# Patient Record
Sex: Female | Born: 1980 | Race: Black or African American | Hispanic: No | Marital: Married | Smoking: Never smoker
Health system: Southern US, Community
[De-identification: ages and names within clinical notes are randomized; demographics above are authoritative.]

## PROBLEM LIST (undated history)

## (undated) DIAGNOSIS — F329 Major depressive disorder, single episode, unspecified: Secondary | ICD-10-CM

## (undated) DIAGNOSIS — K219 Gastro-esophageal reflux disease without esophagitis: Secondary | ICD-10-CM

## (undated) DIAGNOSIS — F32A Depression, unspecified: Secondary | ICD-10-CM

## (undated) DIAGNOSIS — F419 Anxiety disorder, unspecified: Secondary | ICD-10-CM

## (undated) HISTORY — DX: Major depressive disorder, single episode, unspecified: F32.9

## (undated) HISTORY — PX: DILATION AND CURETTAGE OF UTERUS: SHX78

## (undated) HISTORY — DX: Depression, unspecified: F32.A

## (undated) HISTORY — DX: Gastro-esophageal reflux disease without esophagitis: K21.9

---

## 2004-10-21 ENCOUNTER — Ambulatory Visit: Payer: Self-pay | Admitting: Family Medicine

## 2013-02-12 ENCOUNTER — Encounter (HOSPITAL_COMMUNITY): Payer: Self-pay | Admitting: *Deleted

## 2013-02-12 ENCOUNTER — Emergency Department (HOSPITAL_COMMUNITY)
Admission: EM | Admit: 2013-02-12 | Discharge: 2013-02-13 | Disposition: A | Discharge status: Home / Self Care | Payer: 59 | Attending: Emergency Medicine | Admitting: Emergency Medicine

## 2013-02-12 ENCOUNTER — Emergency Department (HOSPITAL_COMMUNITY): Payer: 59

## 2013-02-12 DIAGNOSIS — F411 Generalized anxiety disorder: Secondary | ICD-10-CM | POA: Insufficient documentation

## 2013-02-12 DIAGNOSIS — Z79899 Other long term (current) drug therapy: Secondary | ICD-10-CM | POA: Insufficient documentation

## 2013-02-12 DIAGNOSIS — Z3202 Encounter for pregnancy test, result negative: Secondary | ICD-10-CM | POA: Insufficient documentation

## 2013-02-12 DIAGNOSIS — R109 Unspecified abdominal pain: Secondary | ICD-10-CM | POA: Insufficient documentation

## 2013-02-12 HISTORY — DX: Anxiety disorder, unspecified: F41.9

## 2013-02-12 LAB — URINALYSIS, ROUTINE W REFLEX MICROSCOPIC
Bilirubin Urine: NEGATIVE
Glucose, UA: NEGATIVE mg/dL
Hgb urine dipstick: NEGATIVE
Ketones, ur: NEGATIVE mg/dL
Leukocytes, UA: NEGATIVE
Nitrite: NEGATIVE
Protein, ur: NEGATIVE mg/dL
Specific Gravity, Urine: 1.015 (ref 1.005–1.030)
Urobilinogen, UA: 1 mg/dL (ref 0.0–1.0)
pH: 7 (ref 5.0–8.0)

## 2013-02-12 LAB — PREGNANCY, URINE: Preg Test, Ur: NEGATIVE

## 2013-02-12 NOTE — ED Provider Notes (Signed)
History     CSN: [id number]  Arrival date & time 02/12/13  2231   First MD Initiated Contact with Patient 02/12/13 2255      Chief Complaint  Patient presents with  . Abdominal Pain  . Back Pain    (Consider location/radiation/quality/duration/timing/severity/associated sxs/prior treatment) Patient is a 32 y.o. female presenting with abdominal pain and back pain.  Abdominal Pain Associated symptoms: no chest pain, no cough, no fever, no shortness of breath and no vomiting   Back Pain Associated symptoms: abdominal pain   Associated symptoms: no chest pain, no fever, no headaches and no numbness    Miranda Oliver is a 32 y.o. female who presents to the Emergency Department complaining of sharp stabbing left lower abdominal pain that radiates to the lower back. Currently there is no pain. Had a bowel movement earlier today.  Denies fever, chills, nausea, vomiting. Past Medical History  Diagnosis Date  . Anxiety     Past Surgical History  Procedure Laterality Date  . Dilation and curettage of uterus      History reviewed. No pertinent family history.  History  Substance Use Topics  . Smoking status: Never Smoker   . Smokeless tobacco: Not on file  . Alcohol Use: Yes     Comment: occassional     OB History   Grav Para Term Preterm Abortions TAB SAB Ect Mult Living                  Review of Systems  Constitutional: Negative for fever.       10 Systems reviewed and are negative for acute change except as noted in the HPI.  HENT: Negative for congestion.   Eyes: Negative for discharge and redness.  Respiratory: Negative for cough and shortness of breath.   Cardiovascular: Negative for chest pain.  Gastrointestinal: Positive for abdominal pain. Negative for vomiting.  Musculoskeletal: Negative for back pain.  Skin: Negative for rash.  Neurological: Negative for syncope, numbness and headaches.  Psychiatric/Behavioral:       No behavior change.    Allergies   Review of patient's allergies indicates no known allergies.  Home Medications   Current Outpatient Rx  Name  Route  Sig  Dispense  Refill  . acetaminophen (TYLENOL) 500 MG tablet   Oral   Take 1,000 mg by mouth every 6 (six) hours as needed for pain.         . clonazePAM (KLONOPIN) 0.5 MG tablet   Oral   Take 0.5 mg by mouth daily as needed for anxiety.         . ranitidine (ZANTAC) 150 MG tablet   Oral   Take 150 mg by mouth daily.           BP 149/73  Pulse 80  Temp(Src) 98.6 F (37 C) (Oral)  Ht 5\' 2"  (1.575 m)  Wt 249 lb (112.946 kg)  BMI 45.53 kg/m2  SpO2 100%  Physical Exam  Nursing note and vitals reviewed. Constitutional: She appears well-developed and well-nourished.  Awake, alert, nontoxic appearance.  HENT:  Head: Normocephalic and atraumatic.  Right Ear: External ear normal.  Left Ear: External ear normal.  Eyes: EOM are normal. Pupils are equal, round, and reactive to light. Right eye exhibits no discharge. Left eye exhibits no discharge.  Neck: Normal range of motion. Neck supple.  Cardiovascular: Normal rate and intact distal pulses.   Pulmonary/Chest: Effort normal and breath sounds normal. She exhibits no tenderness.  Abdominal: Soft.  Bowel sounds are normal. There is no tenderness. There is no rebound.  Musculoskeletal: She exhibits no tenderness.  Baseline ROM, no obvious new focal weakness.  Neurological:  Mental status and motor strength appears baseline for patient and situation.  Skin: No rash noted.  Psychiatric: She has a normal mood and affect.    ED Course  Procedures (including critical care time) Results for orders placed during the hospital encounter of 02/12/13  PREGNANCY, URINE      Result Value Range   Preg Test, Ur NEGATIVE  NEGATIVE  URINALYSIS, ROUTINE W REFLEX MICROSCOPIC      Result Value Range   Color, Urine YELLOW  YELLOW   APPearance CLEAR  CLEAR   Specific Gravity, Urine 1.015  1.005 - 1.030   pH 7.0  5.0 -  8.0   Glucose, UA NEGATIVE  NEGATIVE mg/dL   Hgb urine dipstick NEGATIVE  NEGATIVE   Bilirubin Urine NEGATIVE  NEGATIVE   Ketones, ur NEGATIVE  NEGATIVE mg/dL   Protein, ur NEGATIVE  NEGATIVE mg/dL   Urobilinogen, UA 1.0  0.0 - 1.0 mg/dL   Nitrite NEGATIVE  NEGATIVE   Leukocytes, UA NEGATIVE  NEGATIVE    Dg Abd 1 View  02/12/2013  *RADIOLOGY REPORT*  Clinical Data: Abdominal pain.  Back pain.  Constipation for 1 week.  Previous D&C 2 months ago.  Negative urine pregnancy test tonight.  ABDOMEN - 1 VIEW  Comparison: None.  Findings: There is a nonobstructive bowel gas pattern.  No evidence for free intraperitoneal air on this supine view.  No organomegaly evident. Visualized osseous structures have a normal appearance.  IMPRESSION: Negative exam.   Original Report Authenticated By: Norva Pavlov, M.D.     MDM  Patient with sudden onset lower left abdominal pain with radiation to the back. Xray shows stool only in the colon. Reviewed results with patient. Pt stable in ED with no significant deterioration in condition.The patient appears reasonably screened and/or stabilized for discharge and I doubt any other medical condition or other [facility] requiring further screening, evaluation, or treatment in the ED at this time prior to discharge.  MDM Reviewed: nursing note and vitals Interpretation: x-ray and labs           [facility]. Colon Branch, MD 02/12/13 [phone]

## 2013-02-12 NOTE — ED Notes (Signed)
Pt with abd pain and back pain, denies burning on urination, denies N/V, diarrhea earlier this week, + vaginal discharge

## 2013-02-13 NOTE — ED Notes (Signed)
Discharge instructions reviewed with pt, questions answered. Pt verbalized understanding.  

## 2014-02-15 ENCOUNTER — Encounter: Payer: Self-pay | Admitting: *Deleted

## 2014-02-15 ENCOUNTER — Ambulatory Visit (INDEPENDENT_AMBULATORY_CARE_PROVIDER_SITE_OTHER): Payer: 59 | Admitting: Obstetrics and Gynecology

## 2014-02-15 ENCOUNTER — Encounter: Payer: Self-pay | Admitting: Obstetrics and Gynecology

## 2014-02-15 ENCOUNTER — Encounter (INDEPENDENT_AMBULATORY_CARE_PROVIDER_SITE_OTHER): Payer: Self-pay

## 2014-02-15 VITALS — BP 120/72 | Ht 63.0 in | Wt 248.0 lb

## 2014-02-15 DIAGNOSIS — Z3169 Encounter for other general counseling and advice on procreation: Secondary | ICD-10-CM | POA: Insufficient documentation

## 2014-02-15 NOTE — Progress Notes (Signed)
This chart was scribed by Bennett Scrapehristina Taylor, Medical Scribe, for Dr. Christin BachJohn Leea Rambeau on 02/15/14 at 12:39 PM. This chart was reviewed by Dr. Christin BachJohn Eveleen Mcnear and is accurate.    [facility] Visit  Patient name: Miranda Oliver MRN [id number]  Date of birth: 02-02-1981  CC & HPI:  Miranda KaysKimberly N Blassingame is a 33 y.o. female presenting today for procreation advice. Pt reports that she is G3P1A2. First child born close to due date, induced on week before. Child is now 31 years old. Last two pregnancies have ended in miscarriages. First miscarriage ended at 9 weeks with no identified heart beat and 2nd miscarriage ended at 14 weeks after heart beat was identified and then lost. All 3 pregnancies were fathered by the same partner. [facility] [place]. LNMP was 01/22/14. Menses last 4 days while on birth control pills. Had Mirena but had it taken out due to vaginal bleeding complications. Menses are regular while not on contraception devices. Not currently trying to get pregnant due to being on klonopin and 10 mg Prozac currently for depression/anxiety s/p miscarriages.   ROS:  None Complaining of difficulty carrying pregnancies to term  Pertinent History Reviewed:  Medical & Surgical Hx:  Reviewed: Significant for D&C. Denies HTN history  Medications: Reviewed & Updated - see associated section Social History: Reviewed -  reports that she has never smoked. She has never used smokeless tobacco.  Objective Findings:  Vitals: BP 120/72  Ht 5\' 3"  (1.6 m)  Wt 248 lb (112.492 kg)  BMI 43.94 kg/m2  LMP 01/22/2014  Physical Examination: Not indicated   Assessment & Plan:  A: 1. Procreation advice   P: 1. Stop Klonopin today 2. Taper Prozac in 2 weeks and then stop 3. Finish birth control packet and then start pregnancy vitamins 4. Monitor menses for the next 3 months and f/u in 4 months

## 2014-02-15 NOTE — Patient Instructions (Signed)
Stop Klonopin today. Finish birth control pill packet and start prenantal vitamins after last pill. Taper yourself off of Prozac in 2 weeks. Check out [email or web address]. Make a period calender for 3 months and follow up. Check pregnancy test if 5 days late for period and call the office.

## 2014-02-16 ENCOUNTER — Ambulatory Visit: Payer: Self-pay | Admitting: Obstetrics and Gynecology

## 2014-05-11 ENCOUNTER — Ambulatory Visit: Payer: 59 | Admitting: Adult Health

## 2014-05-24 ENCOUNTER — Other Ambulatory Visit: Payer: Self-pay | Admitting: Obstetrics and Gynecology

## 2014-05-24 DIAGNOSIS — O3680X0 Pregnancy with inconclusive fetal viability, not applicable or unspecified: Secondary | ICD-10-CM

## 2014-05-24 DIAGNOSIS — O09299 Supervision of pregnancy with other poor reproductive or obstetric history, unspecified trimester: Secondary | ICD-10-CM

## 2014-05-25 ENCOUNTER — Ambulatory Visit (INDEPENDENT_AMBULATORY_CARE_PROVIDER_SITE_OTHER): Payer: 59

## 2014-05-25 ENCOUNTER — Other Ambulatory Visit: Payer: 59

## 2014-05-25 DIAGNOSIS — O09299 Supervision of pregnancy with other poor reproductive or obstetric history, unspecified trimester: Secondary | ICD-10-CM

## 2014-05-25 DIAGNOSIS — O3680X0 Pregnancy with inconclusive fetal viability, not applicable or unspecified: Secondary | ICD-10-CM

## 2014-05-25 NOTE — Progress Notes (Addendum)
U/S-by LMP dates pt would be 8+ wks although pt had 1st +UPT at home on 05/20/2014 and stated that she had [facility] on Friday which had shown a Quant of 58 per pt. U/s today shows anteverted uterus noted with thickened endometrium, bilateral ovaries appear WNL no free fluid noted within pelvis. I spoke to Cyril MourningJennifer Griffin she ordered labs today and will follow up with patient tomorrow.

## 2014-05-26 ENCOUNTER — Telehealth: Payer: Self-pay | Admitting: Adult Health

## 2014-05-26 LAB — PROGESTERONE: Progesterone: 9 ng/mL

## 2014-05-26 LAB — HCG, QUANTITATIVE, PREGNANCY: hCG, Beta Chain, Quant, S: 609 m[IU]/mL

## 2014-05-26 MED ORDER — PROGESTERONE MICRONIZED 200 MG PO CAPS: ORAL_CAPSULE | ORAL | Status: DC

## 2014-05-26 NOTE — Telephone Encounter (Signed)
Pt aware of labs will rx prometrium 200 mg 1 in vagina at hs #30 with 3 refills, [facility] and keep US appt, discussed with Dr Despina HiddenEure

## 2014-05-26 NOTE — Telephone Encounter (Signed)
Spoke with pt and informed her of her lab results, pt aware that she would need either progesterone suppositories or prometrium. Pt wanted prometrium instead of the progesterone suppositories so she could get it at the same pharmacy.  I spoke with JAG and she advised that she would need to have blood drawn again on Monday since US appointment was pushed back a week per pt request. Pt switched up front to make appointment for Monday for a QHCG.

## 2014-05-29 ENCOUNTER — Other Ambulatory Visit: Payer: 59

## 2014-05-29 DIAGNOSIS — O09299 Supervision of pregnancy with other poor reproductive or obstetric history, unspecified trimester: Secondary | ICD-10-CM

## 2014-05-30 ENCOUNTER — Telehealth: Payer: Self-pay | Admitting: *Deleted

## 2014-05-30 LAB — HCG, QUANTITATIVE, PREGNANCY: hCG, Beta Chain, Quant, S: 2547.2 m[IU]/mL

## 2014-05-30 NOTE — Telephone Encounter (Signed)
I spoke to KRB about results, she advised numbers look good and for the pt to keep her appointment, no need to recheck blood work. Pt aware of results. Pt advised to keep her appointment that she already has scheduled and to keep using the medication as adivsed. Pt also advised to call the office if anything changes, if bleeding occurs, pain occurs etc. Pt verbalized understanding.

## 2014-06-09 ENCOUNTER — Other Ambulatory Visit: Payer: 59

## 2014-06-11 LAB — OB RESULTS CONSOLE GC/CHLAMYDIA
Chlamydia: NEGATIVE
Gonorrhea: NEGATIVE

## 2014-06-12 ENCOUNTER — Telehealth: Payer: Self-pay | Admitting: Women's Health

## 2014-06-12 NOTE — Telephone Encounter (Signed)
Patient states that she is having back pain/muscle spasm, she is pregnant but not sure how far along she is, she has a follow up u/s scheduled for early next week.  Advised her to take Tylenol and let us know if she has any other problems, pt verbalized understanding.

## 2014-06-14 ENCOUNTER — Other Ambulatory Visit: Payer: Self-pay | Admitting: Women's Health

## 2014-06-14 DIAGNOSIS — O3680X0 Pregnancy with inconclusive fetal viability, not applicable or unspecified: Secondary | ICD-10-CM

## 2014-06-19 ENCOUNTER — Ambulatory Visit: Payer: 59 | Admitting: Obstetrics and Gynecology

## 2014-06-19 ENCOUNTER — Ambulatory Visit (INDEPENDENT_AMBULATORY_CARE_PROVIDER_SITE_OTHER): Payer: BC Managed Care – PPO

## 2014-06-19 ENCOUNTER — Other Ambulatory Visit: Payer: Self-pay | Admitting: Women's Health

## 2014-06-19 ENCOUNTER — Ambulatory Visit: Payer: BC Managed Care – PPO | Admitting: Obstetrics and Gynecology

## 2014-06-19 DIAGNOSIS — O26849 Uterine size-date discrepancy, unspecified trimester: Secondary | ICD-10-CM

## 2014-06-19 DIAGNOSIS — O3680X0 Pregnancy with inconclusive fetal viability, not applicable or unspecified: Secondary | ICD-10-CM

## 2014-06-19 NOTE — Progress Notes (Signed)
U/S-single IUP with +FCA noted, FHR-168 bpm, CRL c/w 7+6wks EDD 01/30/2015, cx appears closed, bilateral adnexa appears WNL

## 2014-06-27 ENCOUNTER — Encounter: Payer: Self-pay | Admitting: Women's Health

## 2014-06-27 ENCOUNTER — Ambulatory Visit (INDEPENDENT_AMBULATORY_CARE_PROVIDER_SITE_OTHER): Payer: BC Managed Care – PPO | Admitting: Women's Health

## 2014-06-27 VITALS — BP 150/60 | Ht 62.0 in | Wt 265.0 lb

## 2014-06-27 DIAGNOSIS — E669 Obesity, unspecified: Secondary | ICD-10-CM | POA: Insufficient documentation

## 2014-06-27 DIAGNOSIS — Z331 Pregnant state, incidental: Secondary | ICD-10-CM

## 2014-06-27 DIAGNOSIS — Z3480 Encounter for supervision of other normal pregnancy, unspecified trimester: Secondary | ICD-10-CM | POA: Insufficient documentation

## 2014-06-27 DIAGNOSIS — Z36 Encounter for antenatal screening of mother: Secondary | ICD-10-CM

## 2014-06-27 DIAGNOSIS — Z1389 Encounter for screening for other disorder: Secondary | ICD-10-CM

## 2014-06-27 DIAGNOSIS — Z3481 Encounter for supervision of other normal pregnancy, first trimester: Secondary | ICD-10-CM

## 2014-06-27 LAB — CBC
HCT: 34.4 % — ABNORMAL LOW (ref 36.0–46.0)
Hemoglobin: 11.8 g/dL — ABNORMAL LOW (ref 12.0–15.0)
MCH: 29.5 pg (ref 26.0–34.0)
MCHC: 34.3 g/dL (ref 30.0–36.0)
MCV: 86 fL (ref 78.0–100.0)
Platelets: 290 10*3/uL (ref 150–400)
RBC: 4 MIL/uL (ref 3.87–5.11)
RDW: 14.5 % (ref 11.5–15.5)
WBC: 4.4 10*3/uL (ref 4.0–10.5)

## 2014-06-27 LAB — POCT URINALYSIS DIPSTICK
Blood, UA: NEGATIVE
Glucose, UA: NEGATIVE
Ketones, UA: NEGATIVE
Leukocytes, UA: NEGATIVE
Nitrite, UA: NEGATIVE

## 2014-06-27 NOTE — Patient Instructions (Signed)

## 2014-06-27 NOTE — Progress Notes (Signed)
  Subjective:  Miranda Oliver is a 33 y.o. 584P1021 African American female at 45752w0d by 7wk u/s, being seen today for her first obstetrical visit.  Her obstetrical history is significant for obesity, term uncomplicated svd in 2007, then 2 MABs requiring d&c. Taking progesterone suppositories prophylactically. Denies h/o HTN, states she works at [employer] and has been taking her bp this pregnancy, and systolic has generally been in 150s. Pregnancy history fully reviewed.  Patient reports some occ nause, no vomiting- declines meds. Has had white d/c since Mirena removed in 2013- no itching/irritation/odor. Denies vb, cramping, uti s/s.  BP 150/60  Ht 5\' 2"  (1.575 m)  Wt 265 lb (120.203 kg)  BMI 48.46 kg/m2  LMP 03/29/2014 BP retake still 150/60  HISTORY: OB History  Gravida Para Term Preterm AB SAB TAB Ectopic Multiple Living  4 1 1  0 2 2 0 0 0 1    # Outcome Date GA Lbr Len/2nd Weight Sex Delivery Anes PTL Lv  4 CUR           3 TRM 11/18/06 323752w0d  6 lb 13 oz (3.09 kg) M SVD EPI  Y  2 SAB              Comments: D&C  1 SAB              Comments: D&C     Past Medical History  Diagnosis Date  . Anxiety   . GERD (gastroesophageal reflux disease)   . Depression    Past Surgical History  Procedure Laterality Date  . Dilation and curettage of uterus  2013 & 2014     MAB x2    Family History  Problem Relation Age of Onset  . Stroke Mother   . Hypertension Mother   . Diabetes Mother   . Hypertension Sister   . Hypertension Father     Exam   System:     General: Well developed & nourished, no acute distress   Skin: Warm & dry, normal coloration and turgor, no rashes   Neurologic: Alert & oriented, normal mood   Cardiovascular: Regular rate & rhythm   Respiratory: Effort & rate normal, LCTAB, acyanotic   Abdomen: Soft, non tender   Extremities: normal strength, tone   Thin prep pap smear 2014 neg in [place] at [facility]  FHR: 168 via informal  transabdominal u/s   Assessment:   Pregnancy: [id number] Patient Active Problem List   Diagnosis Date Noted  . Supervision of other normal pregnancy 06/27/2014    Priority: High  . General counseling and advice for procreative management 02/15/2014    51752w0d G4P1021 New OB visit Elevated bp today- no h/o HTN Mild nausea of pregnancy Obesity H/O MAB x 2 w/ 2 D&Cs  Plan:  Initial labs drawn Continue prenatal vitamins Problem list reviewed and updated Reviewed n/v relief measures and warning s/s to report Reviewed recommended weight gain based on pre-gravid BMI Encouraged well-balanced diet Genetic Screening discussed Integrated Screen: requested Cystic fibrosis screening discussed requested Ultrasound discussed; fetal survey: requested Follow up in 2 days for bp recheck, then 3weeks for visit and 1st it/nt CCNC completed Get pap results from [facility] from [address]2014  Aharon Carriere, Emmaly Randall CNM, [facility]BC 06/27/2014 2:11 PM

## 2014-06-28 ENCOUNTER — Encounter: Payer: Self-pay | Admitting: Women's Health

## 2014-06-28 ENCOUNTER — Telehealth: Payer: Self-pay | Admitting: Advanced Practice Midwife

## 2014-06-28 LAB — GC/CHLAMYDIA PROBE AMP
CT Probe RNA: NEGATIVE
GC Probe RNA: NEGATIVE

## 2014-06-28 LAB — URINE CULTURE
Colony Count: NO GROWTH
Organism ID, Bacteria: NO GROWTH

## 2014-06-28 LAB — DRUG SCREEN, URINE, NO CONFIRMATION
Amphetamine Screen, Ur: NEGATIVE
Barbiturate Quant, Ur: NEGATIVE
Benzodiazepines.: NEGATIVE
Cocaine Metabolites: NEGATIVE
Creatinine,U: 283.4 mg/dL
Marijuana Metabolite: NEGATIVE
Methadone: NEGATIVE
Opiate Screen, Urine: NEGATIVE
Phencyclidine (PCP): NEGATIVE
Propoxyphene: NEGATIVE

## 2014-06-28 LAB — OXYCODONE SCREEN, UA, RFLX CONFIRM: Oxycodone Screen, Ur: NEGATIVE ng/mL

## 2014-06-28 LAB — URINALYSIS, ROUTINE W REFLEX MICROSCOPIC
Bilirubin Urine: NEGATIVE
Glucose, UA: NEGATIVE mg/dL
Hgb urine dipstick: NEGATIVE
Ketones, ur: NEGATIVE mg/dL
Leukocytes, UA: NEGATIVE
Nitrite: NEGATIVE
Protein, ur: NEGATIVE mg/dL
Specific Gravity, Urine: >1.03 — ABNORMAL HIGH (ref 1.005–1.030)
Urobilinogen, UA: 1 mg/dL (ref 0.0–1.0)
pH: 6.5 (ref 5.0–8.0)

## 2014-06-28 LAB — RPR

## 2014-06-28 LAB — ABO AND RH: Rh Type: POSITIVE

## 2014-06-28 LAB — VARICELLA ZOSTER ANTIBODY, IGG: Varicella IgG: 2309 Index — ABNORMAL HIGH (ref <135.00)

## 2014-06-28 LAB — SICKLE CELL SCREEN: Sickle Cell Screen: NEGATIVE

## 2014-06-28 LAB — RUBELLA SCREEN: Rubella: 2.1 Index — ABNORMAL HIGH (ref <0.90)

## 2014-06-28 LAB — HIV ANTIBODY (ROUTINE TESTING W REFLEX): HIV 1&2 Ab, 4th Generation: NONREACTIVE

## 2014-06-28 LAB — HEPATITIS B SURFACE ANTIGEN: Hepatitis B Surface Ag: NEGATIVE

## 2014-06-28 LAB — CYSTIC FIBROSIS DIAGNOSTIC STUDY

## 2014-06-28 LAB — ANTIBODY SCREEN: Antibody Screen: NEGATIVE

## 2014-06-28 NOTE — Telephone Encounter (Signed)
Pt informed that all lab results were normal as of today.

## 2014-06-29 ENCOUNTER — Ambulatory Visit (INDEPENDENT_AMBULATORY_CARE_PROVIDER_SITE_OTHER): Payer: Self-pay | Admitting: Advanced Practice Midwife

## 2014-06-29 VITALS — BP 140/70 | Wt 263.0 lb

## 2014-06-29 DIAGNOSIS — Z331 Pregnant state, incidental: Secondary | ICD-10-CM

## 2014-06-29 DIAGNOSIS — Z1389 Encounter for screening for other disorder: Secondary | ICD-10-CM

## 2014-06-29 DIAGNOSIS — Z3480 Encounter for supervision of other normal pregnancy, unspecified trimester: Secondary | ICD-10-CM

## 2014-06-29 DIAGNOSIS — Z3481 Encounter for supervision of other normal pregnancy, first trimester: Secondary | ICD-10-CM

## 2014-06-29 LAB — POCT URINALYSIS DIPSTICK
Blood, UA: NEGATIVE
Glucose, UA: NEGATIVE
Ketones, UA: NEGATIVE
Leukocytes, UA: NEGATIVE
Nitrite, UA: NEGATIVE

## 2014-06-29 NOTE — Progress Notes (Signed)
[id number] 6815w2d Estimated Date of Delivery: 01/30/15  Blood pressure 140/70, weight 263 lb (119.296 kg), last menstrual period 03/29/2014.   BP weight and urine results all reviewed and noted.  Please refer to the obstetrical flow sheet for the fundal height and fetal heart rate documentation: Here for b/p f/u;  Normal-ish today, not medication worthy  Patient reports good fetal movement, denies any bleeding and no rupture of membranes symptoms or regular contractions. Patient is without complaints. All questions were answered.  Plan:  Continued routine obstetrical care, keep eye on b/p  Follow up in 3 weeks for OB appointment, NT/IT

## 2014-07-04 ENCOUNTER — Encounter: Payer: Self-pay | Admitting: Women's Health

## 2014-07-07 ENCOUNTER — Telehealth: Payer: Self-pay | Admitting: Obstetrics & Gynecology

## 2014-07-07 NOTE — Telephone Encounter (Signed)
Pt states feels bloating what can she take. Pt takes has been taking tums. Pt informed unable to take any thing else for bloating due to early pregnancy. Pt verbalized understanding.

## 2014-07-10 ENCOUNTER — Telehealth: Payer: Self-pay | Admitting: Women's Health

## 2014-07-10 NOTE — Telephone Encounter (Signed)
Pt states has some aching in vaginal area after being on feet all day, no bleeding and no cramping.  Advised pt to take Tylenol as needed for pain, push fluids and rest when can and notify us if has any bleeding or severe cramping.  Pt verbalized understanding.

## 2014-07-19 ENCOUNTER — Other Ambulatory Visit: Payer: BC Managed Care – PPO

## 2014-07-19 ENCOUNTER — Encounter: Payer: BC Managed Care – PPO | Admitting: Advanced Practice Midwife

## 2014-07-20 ENCOUNTER — Encounter: Payer: Self-pay | Admitting: Advanced Practice Midwife

## 2014-07-20 ENCOUNTER — Ambulatory Visit (INDEPENDENT_AMBULATORY_CARE_PROVIDER_SITE_OTHER): Payer: Self-pay | Admitting: Advanced Practice Midwife

## 2014-07-20 ENCOUNTER — Ambulatory Visit (INDEPENDENT_AMBULATORY_CARE_PROVIDER_SITE_OTHER): Payer: BC Managed Care – PPO

## 2014-07-20 VITALS — BP 128/76 | Wt 258.0 lb

## 2014-07-20 DIAGNOSIS — Z1389 Encounter for screening for other disorder: Secondary | ICD-10-CM

## 2014-07-20 DIAGNOSIS — Z3481 Encounter for supervision of other normal pregnancy, first trimester: Secondary | ICD-10-CM

## 2014-07-20 DIAGNOSIS — Z331 Pregnant state, incidental: Secondary | ICD-10-CM

## 2014-07-20 DIAGNOSIS — Z3480 Encounter for supervision of other normal pregnancy, unspecified trimester: Secondary | ICD-10-CM

## 2014-07-20 DIAGNOSIS — Z36 Encounter for antenatal screening of mother: Secondary | ICD-10-CM

## 2014-07-20 LAB — POCT URINALYSIS DIPSTICK
Blood, UA: NEGATIVE
Glucose, UA: NEGATIVE
Ketones, UA: NEGATIVE
Leukocytes, UA: NEGATIVE
Nitrite, UA: NEGATIVE
Protein, UA: NEGATIVE

## 2014-07-20 NOTE — Progress Notes (Signed)
[id number] 4192w2d Estimated Date of Delivery: 01/30/15  Blood pressure 128/76, weight 258 lb (117.028 kg), last menstrual period 03/29/2014.   BP weight and urine results all reviewed and noted.  Please refer to the obstetrical flow sheet for the fundal height and fetal heart rate documentation: Had NT today; normal NT, but unable to view nasal bone d/t position  Patient reports good fetal movement, denies any bleeding and no rupture of membranes symptoms or regular contractions. Patient is without complaints. All questions were answered.  Plan:  Continued routine obstetrical care, IT today  Follow up in 4 weeks for OB appointment, 2nd IT

## 2014-07-20 NOTE — Progress Notes (Signed)
Pt states that she is having some bloating and gas.

## 2014-07-20 NOTE — Progress Notes (Signed)
U/S(12+2wks)-single IUP with +FCA noted, FHR- 158 bpm, cx appears closed, bilateral adnexa appears WNL, NT-1.7735mm, unable to view NB due to fetal position (breech position and face down), posterior Gr 0 placenta

## 2014-07-26 LAB — MATERNAL SCREEN, INTEGRATED #1

## 2014-08-08 ENCOUNTER — Telehealth: Payer: Self-pay | Admitting: Women's Health

## 2014-08-08 NOTE — Telephone Encounter (Signed)
Pt states she worked 8 hrs yesterday and when she got home last night started having pulling/cramping feeling in vaginal area, reports no bleeding.  Cramping has eased up some this morning, advised pt to take Tylenol, push fluids and rest when possible and to call us back if bleeding develops.  Pt verbalized understanding.

## 2014-08-17 ENCOUNTER — Ambulatory Visit (INDEPENDENT_AMBULATORY_CARE_PROVIDER_SITE_OTHER): Payer: BC Managed Care – PPO | Admitting: Advanced Practice Midwife

## 2014-08-17 ENCOUNTER — Encounter: Payer: Self-pay | Admitting: Advanced Practice Midwife

## 2014-08-17 VITALS — BP 122/76 | Wt 253.0 lb

## 2014-08-17 DIAGNOSIS — Z1389 Encounter for screening for other disorder: Secondary | ICD-10-CM

## 2014-08-17 DIAGNOSIS — Z36 Encounter for antenatal screening of mother: Secondary | ICD-10-CM

## 2014-08-17 DIAGNOSIS — Z3482 Encounter for supervision of other normal pregnancy, second trimester: Secondary | ICD-10-CM

## 2014-08-17 DIAGNOSIS — Z3480 Encounter for supervision of other normal pregnancy, unspecified trimester: Secondary | ICD-10-CM

## 2014-08-17 DIAGNOSIS — Z331 Pregnant state, incidental: Secondary | ICD-10-CM

## 2014-08-17 LAB — POCT URINALYSIS DIPSTICK
Blood, UA: NEGATIVE
Glucose, UA: NEGATIVE
Ketones, UA: NEGATIVE
Leukocytes, UA: NEGATIVE
Nitrite, UA: NEGATIVE

## 2014-08-17 NOTE — Progress Notes (Signed)
O1H0865 [redacted]w[redacted]d Estimated Date of Delivery: 01/30/15  Blood pressure 122/76, weight 253 lb (114.76 kg), last menstrual period 03/29/2014.   BP weight and urine results all reviewed and noted.  Please refer to the obstetrical flow sheet for the fundal height and fetal heart rate documentation:  Patient reports good fetal movement, denies any bleeding and no rupture of membranes symptoms or regular contractions. Patient is without complaints except what sounds like round ligament pain.  Not nauseated any more, but still does not have an appetite.  Gets several small meals in with snacks. All questions were answered.                Plan:  Continued routine obstetrical care, 2nd IT today  Follow up in 3 weeks for OB appointment, anatomy scan

## 2014-08-17 NOTE — Progress Notes (Signed)
Pt states that she has had pain on both sides of her lower abdomen. Pt states that she had round ligament pain with her 31 year old and that is what the pain feels like.

## 2014-08-23 LAB — MATERNAL SCREEN, INTEGRATED #2
AFP MoM: 1.06
AFP, Serum: 32.3 ng/mL
Age risk Down Syndrome: 1:640 {titer}
Calculated Gestational Age: 16.9
Crown Rump Length: 66.4 mm
Estriol Mom: 1.11
Estriol, Free: 0.94 ng/mL
Inhibin A Dimeric: 114 pg/mL
Inhibin A MoM: 0.86
MSS Down Syndrome: <1:5000 {titer}
MSS Trisomy 18 Risk: <1:5000 {titer}
NT MoM: 0.91
Nuchal Translucency: 1.35 mm
Number of fetuses: 1
PAPP-A MoM: 0.39
PAPP-A: 247 ng/mL
Rish for ONTD: <1:5000 {titer}
hCG MoM: 1
hCG, Serum: 27.4 IU/mL

## 2014-08-24 ENCOUNTER — Encounter: Payer: Self-pay | Admitting: Advanced Practice Midwife

## 2014-08-28 ENCOUNTER — Telehealth: Payer: Self-pay | Admitting: Obstetrics and Gynecology

## 2014-08-28 NOTE — Telephone Encounter (Signed)
Pt informed of normal lab results

## 2014-09-07 ENCOUNTER — Encounter: Payer: Self-pay | Admitting: Advanced Practice Midwife

## 2014-09-07 ENCOUNTER — Other Ambulatory Visit: Payer: Self-pay | Admitting: Advanced Practice Midwife

## 2014-09-07 ENCOUNTER — Ambulatory Visit (INDEPENDENT_AMBULATORY_CARE_PROVIDER_SITE_OTHER): Payer: BC Managed Care – PPO

## 2014-09-07 ENCOUNTER — Ambulatory Visit (INDEPENDENT_AMBULATORY_CARE_PROVIDER_SITE_OTHER): Payer: BC Managed Care – PPO | Admitting: Advanced Practice Midwife

## 2014-09-07 VITALS — BP 132/78 | Wt 251.0 lb

## 2014-09-07 DIAGNOSIS — O09292 Supervision of pregnancy with other poor reproductive or obstetric history, second trimester: Secondary | ICD-10-CM

## 2014-09-07 DIAGNOSIS — Z23 Encounter for immunization: Secondary | ICD-10-CM

## 2014-09-07 DIAGNOSIS — Z1389 Encounter for screening for other disorder: Secondary | ICD-10-CM

## 2014-09-07 DIAGNOSIS — Z3482 Encounter for supervision of other normal pregnancy, second trimester: Secondary | ICD-10-CM

## 2014-09-07 DIAGNOSIS — Z331 Pregnant state, incidental: Secondary | ICD-10-CM

## 2014-09-07 LAB — POCT URINALYSIS DIPSTICK
Blood, UA: NEGATIVE
Glucose, UA: NEGATIVE
Ketones, UA: NEGATIVE
Leukocytes, UA: NEGATIVE
Nitrite, UA: NEGATIVE

## 2014-09-07 NOTE — Progress Notes (Signed)
Pt denies any problems or concerns at this time.  

## 2014-09-07 NOTE — Progress Notes (Signed)
[id number] 4219w2d Estimated Date of Delivery: 01/30/15  Blood pressure 132/78, weight 251 lb (113.853 kg), last menstrual period 03/29/2014.   BP weight and urine results all reviewed and noted.  Please refer to the obstetrical flow sheet for the fundal height and fetal heart rate documentation: Had anatomy scan today;  Normal female, but views limited by body habitus  Patient reports good fetal movement, denies any bleeding and no rupture of membranes symptoms or regular contractions. Patient is without complaints. All questions were answered.  Plan:  Continued routine obstetrical care,   Follow up in 4 weeks for OB appointment,

## 2014-09-07 NOTE — Progress Notes (Signed)
U/S(19+2wks)-active fetus, meas c/w dates, fluid wnl, posterior Gr 0 placenta, cx appears closed (3.7cm), bilateral adnexa appears WNL, FHR-151 bpm, female fetus, no obvious abnl noted although sub-optimal views due to maternal body habitus

## 2014-09-12 ENCOUNTER — Encounter: Payer: Self-pay | Admitting: Obstetrics & Gynecology

## 2014-09-12 ENCOUNTER — Ambulatory Visit (INDEPENDENT_AMBULATORY_CARE_PROVIDER_SITE_OTHER): Payer: BC Managed Care – PPO | Admitting: Obstetrics & Gynecology

## 2014-09-12 VITALS — BP 130/70 | Wt 253.0 lb

## 2014-09-12 DIAGNOSIS — Z3402 Encounter for supervision of normal first pregnancy, second trimester: Secondary | ICD-10-CM

## 2014-09-12 DIAGNOSIS — Z1389 Encounter for screening for other disorder: Secondary | ICD-10-CM

## 2014-09-12 DIAGNOSIS — F410 Panic disorder [episodic paroxysmal anxiety] without agoraphobia: Secondary | ICD-10-CM

## 2014-09-12 DIAGNOSIS — Z331 Pregnant state, incidental: Secondary | ICD-10-CM

## 2014-09-12 LAB — POCT URINALYSIS DIPSTICK
Blood, UA: NEGATIVE
Glucose, UA: NEGATIVE
Ketones, UA: NEGATIVE
Leukocytes, UA: NEGATIVE
Nitrite, UA: NEGATIVE
Protein, UA: NEGATIVE

## 2014-09-12 IMAGING — CR DG ABDOMEN 1V
2 series · 2 of 2 positions shown · non-contrast
Comparison: None.

CLINICAL DATA: Abdominal pain.  Back pain.  Constipation for 1
week.  Previous D&C 2 months ago.  Negative urine pregnancy test
tonight.

ABDOMEN - 1 VIEW

[view not recorded (1 of 2)]
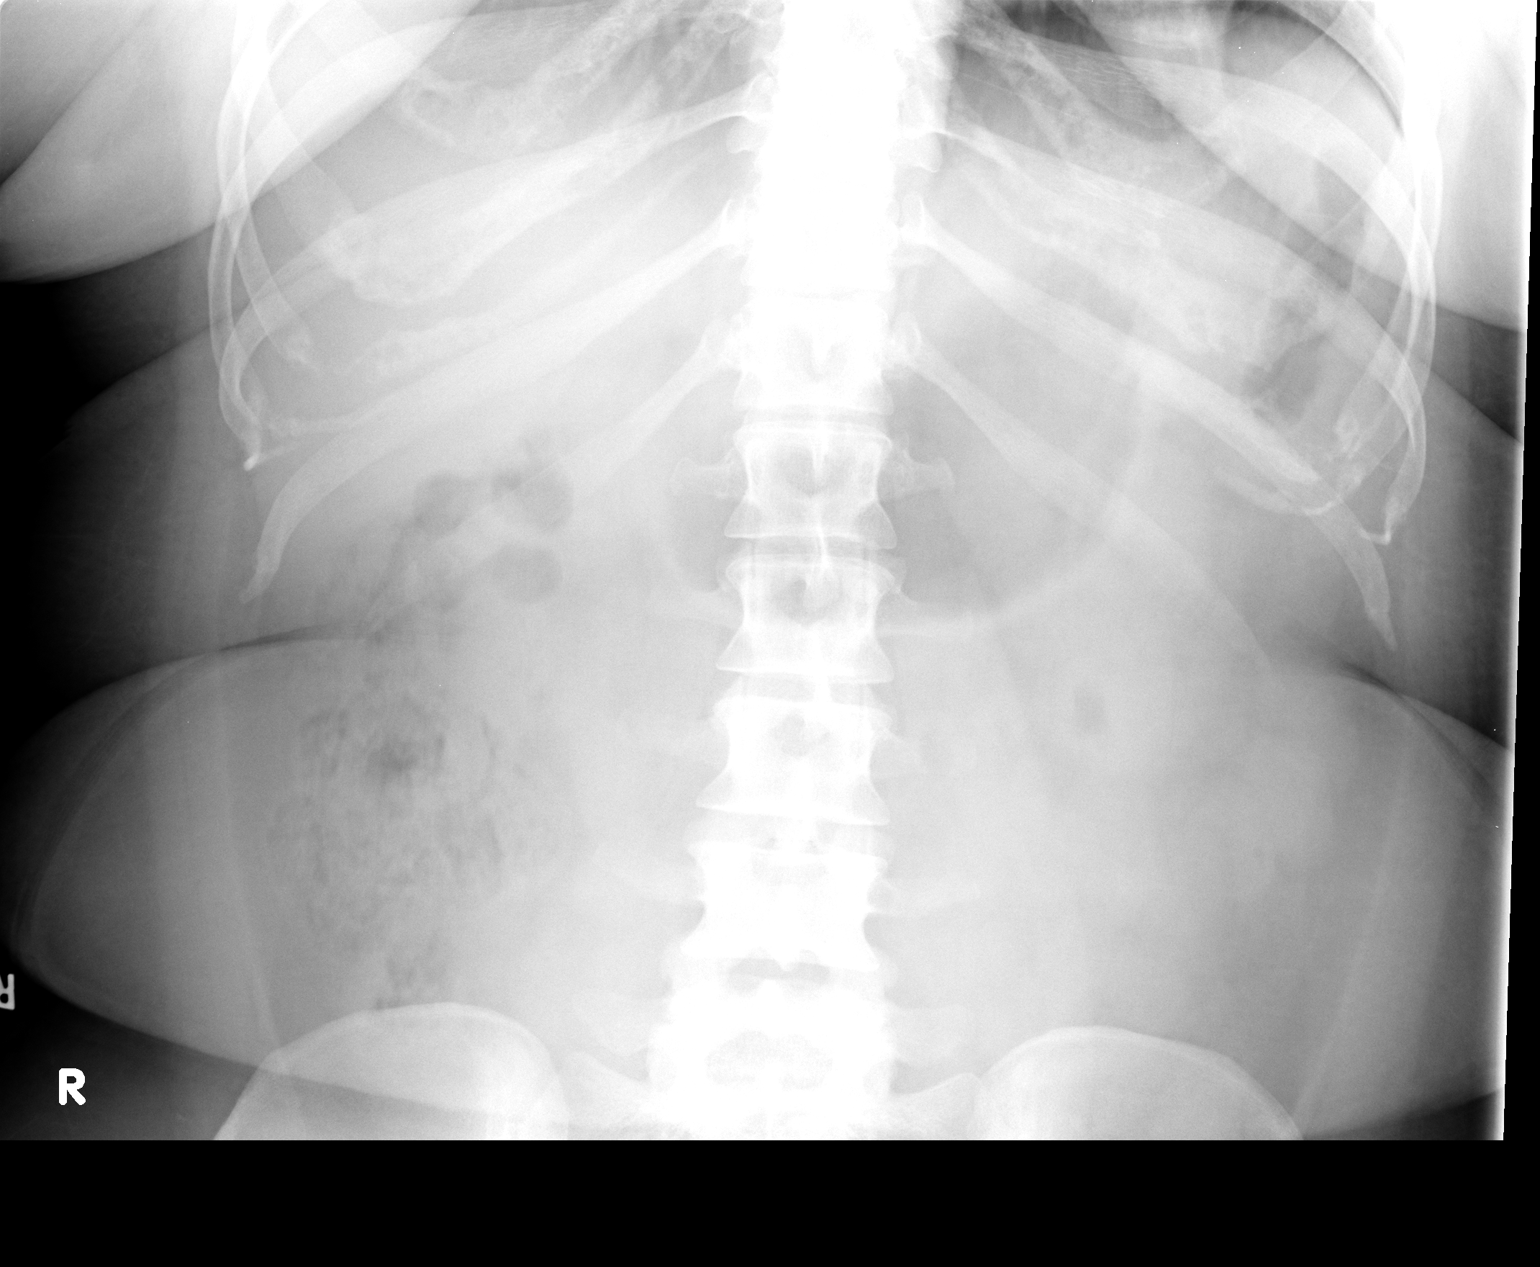

[view not recorded (2 of 2)]
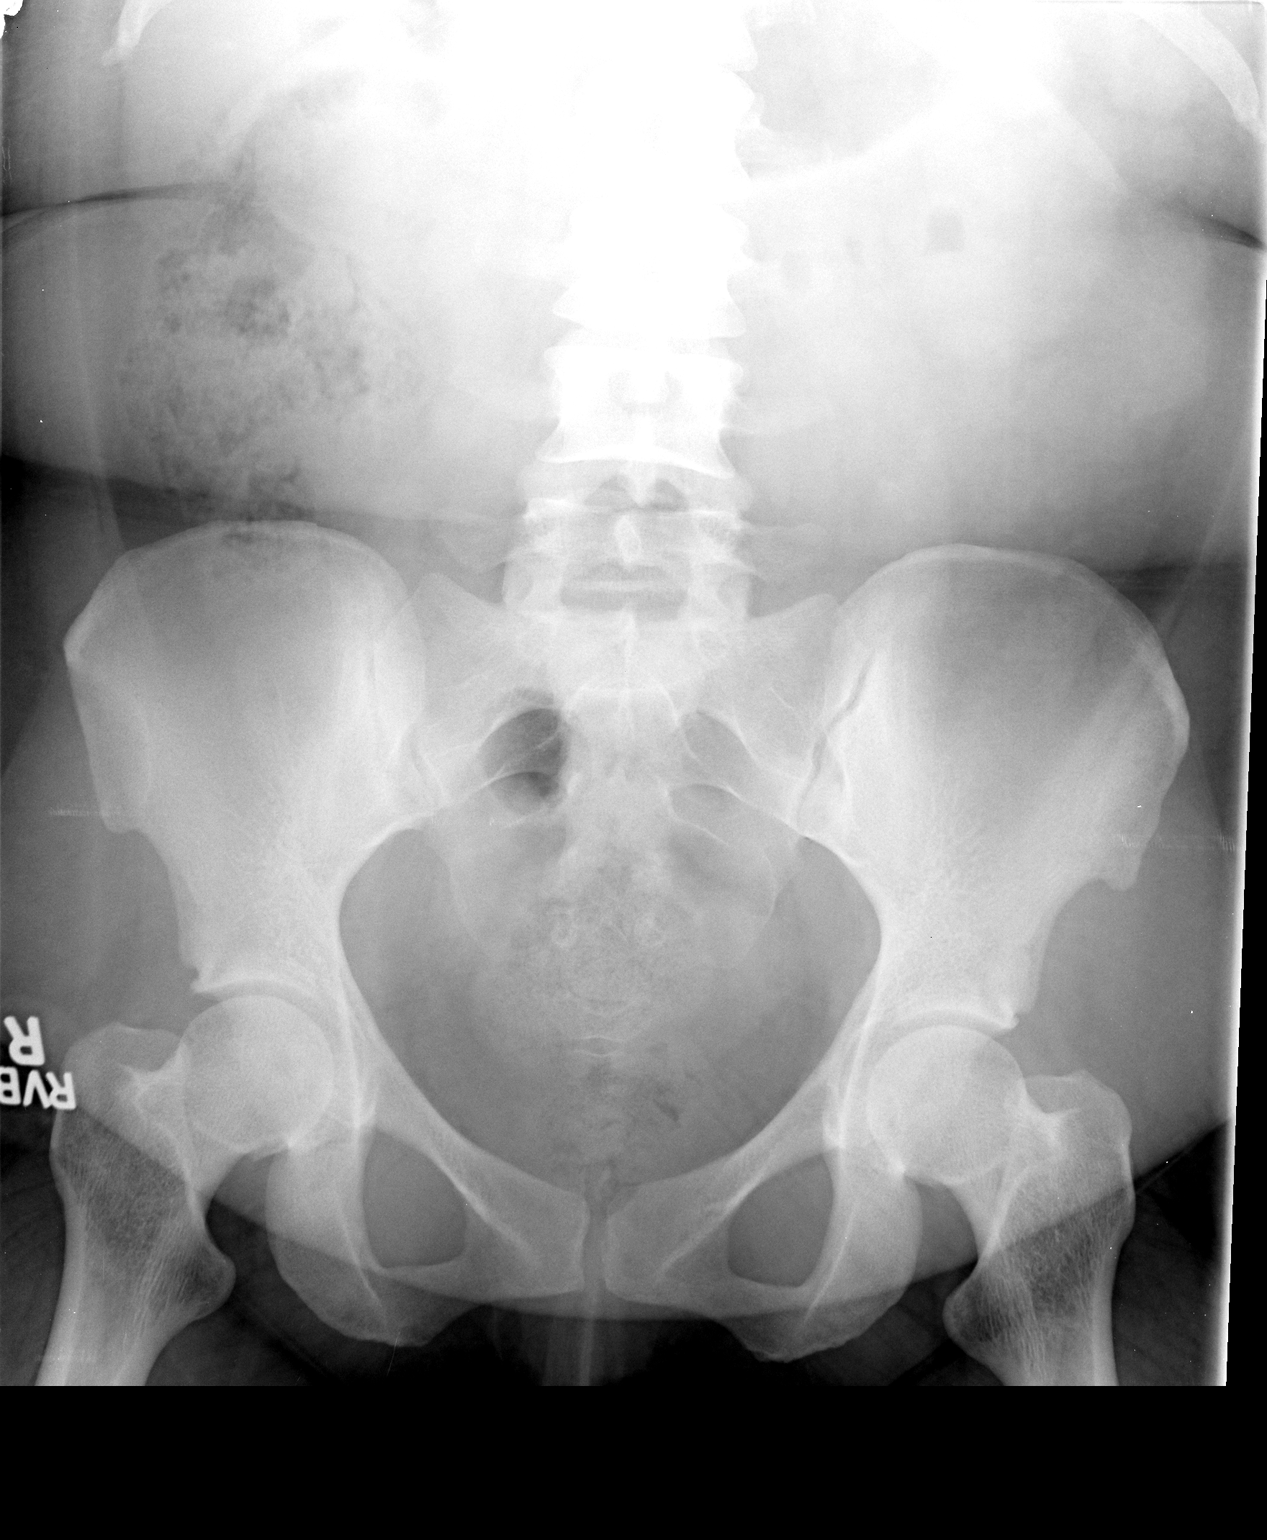

[2 of 2 positions shown; findings below may reference images not displayed]

FINDINGS: There is a nonobstructive bowel gas pattern.  No evidence
for free intraperitoneal air on this supine view.  No organomegaly
evident. Visualized osseous structures have a normal appearance.
IMPRESSION: Negative exam.

## 2014-09-12 NOTE — Progress Notes (Signed)
Pt has history of panic episodes and she has had 2 the past 2 days Instructed to cur caffeine and other strategies were discussed  Z6X0960G4P1021 3250w0d Estimated Date of Delivery: 01/30/15  Blood pressure 130/70, weight 253 lb (114.76 kg), last menstrual period 03/29/2014.   BP weight and urine results all reviewed and noted.  Please refer to the obstetrical flow sheet for the fundal height and fetal heart rate documentation:  Patient reports good fetal movement, denies any bleeding and no rupture of membranes symptoms or regular contractions. Patient is without complaints. All questions were answered.  Plan:  Continued routine obstetrical care,   Follow up in 3 weeks for OB appointment, keep

## 2014-09-12 NOTE — Addendum Note (Signed)
Addended by: Richardson ChiquitoRAVIS, Galen Russman M on: 09/12/2014 11:41 AM   Modules accepted: Orders

## 2014-09-18 ENCOUNTER — Other Ambulatory Visit: Payer: Self-pay | Admitting: Adult Health

## 2014-09-22 ENCOUNTER — Telehealth: Payer: Self-pay | Admitting: Obstetrics & Gynecology

## 2014-09-22 NOTE — Telephone Encounter (Signed)
Pt states has no more refills on the progesterone 200 mg capsule, does she need to continue if so will need refill. Pt informed to continue remaining progesterone capsule until I can speak with provider and will contact pt on Monday. Pt verbalized understanding.

## 2014-09-25 NOTE — Telephone Encounter (Signed)
Pt informed per Dr. Despina HiddenEure no longer needs to continue progesterone. Pt verbalized understanding.

## 2014-09-25 NOTE — Telephone Encounter (Signed)
Pt was on prometrium for progesterone support first 14 weeks, does not need to continue

## 2014-10-02 ENCOUNTER — Encounter: Payer: Self-pay | Admitting: Obstetrics & Gynecology

## 2014-10-05 ENCOUNTER — Ambulatory Visit (INDEPENDENT_AMBULATORY_CARE_PROVIDER_SITE_OTHER): Payer: BC Managed Care – PPO | Admitting: Obstetrics & Gynecology

## 2014-10-05 ENCOUNTER — Encounter: Payer: Self-pay | Admitting: Obstetrics & Gynecology

## 2014-10-05 VITALS — BP 120/70 | Wt 249.0 lb

## 2014-10-05 DIAGNOSIS — Z3402 Encounter for supervision of normal first pregnancy, second trimester: Secondary | ICD-10-CM

## 2014-10-05 DIAGNOSIS — Z331 Pregnant state, incidental: Secondary | ICD-10-CM

## 2014-10-05 DIAGNOSIS — Z1389 Encounter for screening for other disorder: Secondary | ICD-10-CM

## 2014-10-05 NOTE — Progress Notes (Signed)
[id number] 2082w2d Estimated Date of Delivery: 01/30/15  Blood pressure 120/70, weight 249 lb (112.946 kg), last menstrual period 03/29/2014.   BP weight and urine results all reviewed and noted.  Please refer to the obstetrical flow sheet for the fundal height and fetal heart rate documentation:  Patient reports good fetal movement, denies any bleeding and no rupture of membranes symptoms or regular contractions. Patient is without complaints. All questions were answered.  Plan:  Continued routine obstetrical care,   Follow up in 4 weeks for OB appointment, PN2

## 2014-10-24 ENCOUNTER — Telehealth: Payer: Self-pay | Admitting: Advanced Practice Midwife

## 2014-10-24 NOTE — Telephone Encounter (Signed)
Pt called stating that she has had a cough for about 2 weeks, reports good FM, no vaginal bleeding and no cramping.  Advised pt to try taking Robitussin, push fluids and rest when possible and if symptoms don't improve or worsen she can call back and we will get her in to be seen.  Pt verbalized understanding.

## 2014-11-02 ENCOUNTER — Other Ambulatory Visit: Payer: BC Managed Care – PPO

## 2014-11-02 ENCOUNTER — Ambulatory Visit (INDEPENDENT_AMBULATORY_CARE_PROVIDER_SITE_OTHER): Payer: BC Managed Care – PPO

## 2014-11-02 ENCOUNTER — Ambulatory Visit (INDEPENDENT_AMBULATORY_CARE_PROVIDER_SITE_OTHER): Payer: BC Managed Care – PPO | Admitting: Advanced Practice Midwife

## 2014-11-02 VITALS — BP 138/80 | Wt 252.0 lb

## 2014-11-02 DIAGNOSIS — Z1389 Encounter for screening for other disorder: Secondary | ICD-10-CM

## 2014-11-02 DIAGNOSIS — Z131 Encounter for screening for diabetes mellitus: Secondary | ICD-10-CM

## 2014-11-02 DIAGNOSIS — Z113 Encounter for screening for infections with a predominantly sexual mode of transmission: Secondary | ICD-10-CM

## 2014-11-02 DIAGNOSIS — O26842 Uterine size-date discrepancy, second trimester: Secondary | ICD-10-CM

## 2014-11-02 DIAGNOSIS — Z0184 Encounter for antibody response examination: Secondary | ICD-10-CM

## 2014-11-02 DIAGNOSIS — Z331 Pregnant state, incidental: Secondary | ICD-10-CM

## 2014-11-02 DIAGNOSIS — Z3482 Encounter for supervision of other normal pregnancy, second trimester: Secondary | ICD-10-CM

## 2014-11-02 DIAGNOSIS — Z3402 Encounter for supervision of normal first pregnancy, second trimester: Secondary | ICD-10-CM

## 2014-11-02 DIAGNOSIS — Z114 Encounter for screening for human immunodeficiency virus [HIV]: Secondary | ICD-10-CM

## 2014-11-02 LAB — POCT URINALYSIS DIPSTICK
Blood, UA: NEGATIVE
Glucose, UA: NEGATIVE
Glucose, UA: NEGATIVE
Ketones, UA: NEGATIVE
Leukocytes, UA: NEGATIVE
Nitrite, UA: NEGATIVE
Protein, UA: NEGATIVE
Protein, UA: NEGATIVE

## 2014-11-02 LAB — CBC
HCT: 31.4 % — ABNORMAL LOW (ref 36.0–46.0)
Hemoglobin: 10.8 g/dL — ABNORMAL LOW (ref 12.0–15.0)
MCH: 29.1 pg (ref 26.0–34.0)
MCHC: 34.4 g/dL (ref 30.0–36.0)
MCV: 84.6 fL (ref 78.0–100.0)
MPV: 9.5 fL (ref 9.4–12.4)
Platelets: 229 10*3/uL (ref 150–400)
RBC: 3.71 MIL/uL — ABNORMAL LOW (ref 3.87–5.11)
RDW: 14 % (ref 11.5–15.5)
WBC: 3.5 10*3/uL — ABNORMAL LOW (ref 4.0–10.5)

## 2014-11-02 NOTE — Progress Notes (Signed)
U/S(27+2wks)-vtx active fetus, EFw 2 lb 8 oz(60th%tile), fluid WNL SDP-2.6cm AFI-9.0cm, posterior Gr 1 placenta, female fetus, FHR-178 bpm

## 2014-11-02 NOTE — Progress Notes (Signed)
[id number] 2423w2d Estimated Date of Delivery: 01/30/15  Blood pressure 138/80, weight 252 lb (114.306 kg), last menstrual period 03/29/2014.   BP weight and urine results all reviewed and noted.  Please refer to the obstetrical flow sheet for the fundal height and fetal heart rate documentation:  Patient reports good fetal movement, denies any bleeding and no rupture of membranes symptoms or regular contractions. Patient is without complaints. Larey SeatFell a week ago, did not seek treatment d/t baby moving, no bleeding.  No apparent sequelae All questions were answered.  Plan:  Continued routine obstetrical care, PN2 today.  FH increased, but abdomen is shaped oddly, so most likely d/t that. U/S(27+2wks)-vtx active fetus, EFw 2 lb 8 oz(60th%tile), fluid WNL SDP-2.6cm AFI-9.0cm, posterior Gr 1 placenta, female fetus, FHR-178 bpm Follow up in 4 weeks for OB appointment,

## 2014-11-03 LAB — HIV ANTIBODY (ROUTINE TESTING W REFLEX): HIV 1&2 Ab, 4th Generation: NONREACTIVE

## 2014-11-03 LAB — ANTIBODY SCREEN: Antibody Screen: NEGATIVE

## 2014-11-03 LAB — GLUCOSE TOLERANCE, 2 HOURS W/ 1HR
Glucose, 1 hour: 165 mg/dL (ref 70–170)
Glucose, 2 hour: 114 mg/dL (ref 70–139)
Glucose, Fasting: 81 mg/dL (ref 70–99)

## 2014-11-03 LAB — RPR

## 2014-11-03 LAB — HSV 2 ANTIBODY, IGG: HSV 2 Glycoprotein G Ab, IgG: <0.1 IV

## 2014-11-06 ENCOUNTER — Telehealth: Payer: Self-pay | Admitting: Women's Health

## 2014-11-06 NOTE — Telephone Encounter (Signed)
Pt informed of WNL GTT from 11/02/2014, also all other test from this date normal, HGB 10.8. Pt encouraged to continue PNV, foods high in iron, and can add iron supplement. Pt verbalized understanding.

## 2014-11-30 ENCOUNTER — Ambulatory Visit (INDEPENDENT_AMBULATORY_CARE_PROVIDER_SITE_OTHER): Payer: BC Managed Care – PPO | Admitting: Advanced Practice Midwife

## 2014-11-30 VITALS — BP 140/76 | Wt 250.2 lb

## 2014-11-30 DIAGNOSIS — Z1389 Encounter for screening for other disorder: Secondary | ICD-10-CM

## 2014-11-30 DIAGNOSIS — Z331 Pregnant state, incidental: Secondary | ICD-10-CM

## 2014-11-30 DIAGNOSIS — Z3403 Encounter for supervision of normal first pregnancy, third trimester: Secondary | ICD-10-CM

## 2014-11-30 LAB — POCT URINALYSIS DIPSTICK
Blood, UA: NEGATIVE
Glucose, UA: NEGATIVE
Nitrite, UA: NEGATIVE
Protein, UA: NEGATIVE

## 2014-11-30 NOTE — Progress Notes (Signed)
[id number] 3566w2d Estimated Date of Delivery: 01/30/15  Blood pressure 140/76, weight 250 lb 4 oz (113.513 kg), last menstrual period 03/29/2014.   BP weight and urine results all reviewed and noted.  Please refer to the obstetrical flow sheet for the fundal height and fetal heart rate documentation:  Patient reports good fetal movement, denies any bleeding and no rupture of membranes symptoms or regular contractions. Patient is without complaints except normal pregnancy complaints All questions were answered.  Plan:  Continued routine obstetrical care,   Follow up in 2 weeks for OB appointment,

## 2014-12-01 NOTE — L&D Delivery Note (Signed)
Delivery Note At 11:29 AM a viable female was delivered via Vaginal, Spontaneous Delivery (Presentation: Right Occiput Anterior).  APGAR: 9, 9; weight  .pending   Placenta status: Intact, Spontaneous.  Cord: 3 vessels with the following complications: None.    Anesthesia: Epidural  Episiotomy:  none Lacerations:  none Suture Repair: n/a Est. Blood Loss (mL):  300  Mom to postpartum.  Baby to Couplet care / Skin to Skin.  Aunesti Pellegrino 01/17/2015, 11:46 AM

## 2014-12-14 ENCOUNTER — Ambulatory Visit (INDEPENDENT_AMBULATORY_CARE_PROVIDER_SITE_OTHER): Payer: BLUE CROSS/BLUE SHIELD | Admitting: Obstetrics & Gynecology

## 2014-12-14 VITALS — BP 134/80 | Wt 252.0 lb

## 2014-12-14 DIAGNOSIS — Z331 Pregnant state, incidental: Secondary | ICD-10-CM

## 2014-12-14 DIAGNOSIS — Z1389 Encounter for screening for other disorder: Secondary | ICD-10-CM

## 2014-12-14 DIAGNOSIS — Z3403 Encounter for supervision of normal first pregnancy, third trimester: Secondary | ICD-10-CM

## 2014-12-14 LAB — POCT URINALYSIS DIPSTICK
Blood, UA: NEGATIVE
Glucose, UA: NEGATIVE
Ketones, UA: NEGATIVE
Leukocytes, UA: NEGATIVE
Nitrite, UA: NEGATIVE
Protein, UA: NEGATIVE

## 2014-12-14 NOTE — Progress Notes (Signed)
[id number] 326w2d Estimated Date of Delivery: 01/30/15  Blood pressure 134/80, weight 252 lb (114.306 kg), last menstrual period 03/29/2014.   BP weight and urine results all reviewed and noted.  Please refer to the obstetrical flow sheet for the fundal height and fetal heart rate documentation:  Patient reports good fetal movement, denies any bleeding and no rupture of membranes symptoms or regular contractions. Patient is without complaints. All questions were answered.  Plan:  Continued routine obstetrical care,   Follow up in 2 weeks for OB appointment, routine

## 2014-12-27 ENCOUNTER — Ambulatory Visit (INDEPENDENT_AMBULATORY_CARE_PROVIDER_SITE_OTHER): Payer: BLUE CROSS/BLUE SHIELD | Admitting: Advanced Practice Midwife

## 2014-12-27 ENCOUNTER — Encounter: Payer: Self-pay | Admitting: Advanced Practice Midwife

## 2014-12-27 VITALS — BP 130/80 | Wt 255.0 lb

## 2014-12-27 DIAGNOSIS — Z1389 Encounter for screening for other disorder: Secondary | ICD-10-CM

## 2014-12-27 DIAGNOSIS — Z331 Pregnant state, incidental: Secondary | ICD-10-CM

## 2014-12-27 DIAGNOSIS — Z3493 Encounter for supervision of normal pregnancy, unspecified, third trimester: Secondary | ICD-10-CM

## 2014-12-27 LAB — POCT URINALYSIS DIPSTICK
Blood, UA: NEGATIVE
Glucose, UA: NEGATIVE
Ketones, UA: NEGATIVE
Leukocytes, UA: NEGATIVE
Nitrite, UA: NEGATIVE
Protein, UA: NEGATIVE

## 2014-12-27 NOTE — Progress Notes (Signed)
[id number] 5623w1d Estimated Date of Delivery: 01/30/15  Blood pressure 130/80, weight 255 lb (115.667 kg), last menstrual period 03/29/2014.   BP weight and urine results all reviewed and noted.  Please refer to the obstetrical flow sheet for the fundal height and fetal heart rate documentation:  Patient reports good fetal movement, denies any bleeding and no rupture of membranes symptoms or regular contractions. Patient is without complaints. All questions were answered.  Plan:  Continued routine obstetrical care,   Follow up in 1 weeks for OB appointment,

## 2015-01-03 ENCOUNTER — Inpatient Hospital Stay (HOSPITAL_COMMUNITY): Payer: BLUE CROSS/BLUE SHIELD

## 2015-01-03 ENCOUNTER — Encounter (HOSPITAL_COMMUNITY): Payer: Self-pay | Admitting: *Deleted

## 2015-01-03 ENCOUNTER — Ambulatory Visit (INDEPENDENT_AMBULATORY_CARE_PROVIDER_SITE_OTHER): Payer: BLUE CROSS/BLUE SHIELD | Admitting: Advanced Practice Midwife

## 2015-01-03 ENCOUNTER — Inpatient Hospital Stay (HOSPITAL_COMMUNITY)
Admission: AD | Admit: 2015-01-03 | Discharge: 2015-01-03 | Disposition: A | Discharge status: Home / Self Care | Payer: BLUE CROSS/BLUE SHIELD | Source: Ambulatory Visit | Attending: Obstetrics & Gynecology | Admitting: Obstetrics & Gynecology

## 2015-01-03 VITALS — BP 162/108 | Wt 263.0 lb

## 2015-01-03 DIAGNOSIS — K219 Gastro-esophageal reflux disease without esophagitis: Secondary | ICD-10-CM | POA: Diagnosis not present

## 2015-01-03 DIAGNOSIS — O133 Gestational [pregnancy-induced] hypertension without significant proteinuria, third trimester: Secondary | ICD-10-CM | POA: Insufficient documentation

## 2015-01-03 DIAGNOSIS — Z823 Family history of stroke: Secondary | ICD-10-CM | POA: Insufficient documentation

## 2015-01-03 DIAGNOSIS — Z331 Pregnant state, incidental: Secondary | ICD-10-CM

## 2015-01-03 DIAGNOSIS — Z3483 Encounter for supervision of other normal pregnancy, third trimester: Secondary | ICD-10-CM

## 2015-01-03 DIAGNOSIS — O139 Gestational [pregnancy-induced] hypertension without significant proteinuria, unspecified trimester: Secondary | ICD-10-CM | POA: Insufficient documentation

## 2015-01-03 DIAGNOSIS — O9989 Other specified diseases and conditions complicating pregnancy, childbirth and the puerperium: Secondary | ICD-10-CM | POA: Diagnosis present

## 2015-01-03 DIAGNOSIS — O99613 Diseases of the digestive system complicating pregnancy, third trimester: Secondary | ICD-10-CM | POA: Insufficient documentation

## 2015-01-03 DIAGNOSIS — Z3A36 36 weeks gestation of pregnancy: Secondary | ICD-10-CM | POA: Diagnosis not present

## 2015-01-03 DIAGNOSIS — Z1389 Encounter for screening for other disorder: Secondary | ICD-10-CM

## 2015-01-03 DIAGNOSIS — I10 Essential (primary) hypertension: Secondary | ICD-10-CM

## 2015-01-03 DIAGNOSIS — Z3689 Encounter for other specified antenatal screening: Secondary | ICD-10-CM | POA: Insufficient documentation

## 2015-01-03 LAB — COMPREHENSIVE METABOLIC PANEL
ALT: 14 U/L (ref 0–35)
AST: 21 U/L (ref 0–37)
Albumin: 3.2 g/dL — ABNORMAL LOW (ref 3.5–5.2)
Alkaline Phosphatase: 126 U/L — ABNORMAL HIGH (ref 39–117)
Anion gap: 7 (ref 5–15)
BUN: 9 mg/dL (ref 6–23)
CO2: 21 mmol/L (ref 19–32)
Calcium: 8.6 mg/dL (ref 8.4–10.5)
Chloride: 107 mmol/L (ref 96–112)
Creatinine, Ser: 0.65 mg/dL (ref 0.50–1.10)
GFR calc Af Amer: >90 mL/min (ref >90)
GFR calc non Af Amer: >90 mL/min (ref >90)
Glucose, Bld: 133 mg/dL — ABNORMAL HIGH (ref 70–99)
Potassium: 3.4 mmol/L — ABNORMAL LOW (ref 3.5–5.1)
Sodium: 135 mmol/L (ref 135–145)
Total Bilirubin: 0.3 mg/dL (ref 0.3–1.2)
Total Protein: 6.2 g/dL (ref 6.0–8.3)

## 2015-01-03 LAB — CBC
HCT: 32.5 % — ABNORMAL LOW (ref 36.0–46.0)
Hemoglobin: 10.8 g/dL — ABNORMAL LOW (ref 12.0–15.0)
MCH: 29.3 pg (ref 26.0–34.0)
MCHC: 33.2 g/dL (ref 30.0–36.0)
MCV: 88.1 fL (ref 78.0–100.0)
Platelets: 195 10*3/uL (ref 150–400)
RBC: 3.69 MIL/uL — ABNORMAL LOW (ref 3.87–5.11)
RDW: 13.3 % (ref 11.5–15.5)
WBC: 4.1 10*3/uL (ref 4.0–10.5)

## 2015-01-03 LAB — POCT URINALYSIS DIPSTICK
Blood, UA: NEGATIVE
Glucose, UA: NEGATIVE
Ketones, UA: NEGATIVE
Leukocytes, UA: NEGATIVE
Nitrite, UA: NEGATIVE
Protein, UA: 1

## 2015-01-03 LAB — PROTEIN / CREATININE RATIO, URINE
Creatinine, Urine: 109 mg/dL
Protein Creatinine Ratio: 0.06 (ref 0.00–0.15)
Total Protein, Urine: 7 mg/dL

## 2015-01-03 MED ORDER — ACETAMINOPHEN 325 MG PO TABS
650.0000 mg | ORAL_TABLET | Freq: Four times a day (QID) | ORAL | Status: DC | PRN
Start: 2015-01-03 — End: 2015-01-03
  Administered 2015-01-03: 650 mg via ORAL
  Filled 2015-01-03: qty 2

## 2015-01-03 NOTE — Patient Instructions (Signed)

## 2015-01-03 NOTE — MAU Note (Signed)
Urine in lab 

## 2015-01-03 NOTE — MAU Note (Signed)
[facility].

## 2015-01-03 NOTE — Discharge Instructions (Signed)
Hypertension During Pregnancy °Hypertension, or high blood pressure, is when there is extra pressure inside your blood vessels that carry blood from the heart to the rest of your body (arteries). It can happen at any time in life, including pregnancy. Hypertension during pregnancy can cause problems for you and your baby. Your baby might not weigh as much as he or she should at birth or might be born early (premature). Very bad cases of hypertension during pregnancy can be life-threatening.  °Different types of hypertension can occur during pregnancy. These include: °· Chronic hypertension. This happens when a woman has hypertension before pregnancy and it continues during pregnancy. °· Gestational hypertension. This is when hypertension develops during pregnancy. °· Preeclampsia or toxemia of pregnancy. This is a very serious type of hypertension that develops only during pregnancy. It affects the whole body and can be very dangerous for both mother and baby.   °Gestational hypertension and preeclampsia usually go away after your baby is born. Your blood pressure will likely stabilize within 6 weeks. Women who have hypertension during pregnancy have a greater chance of developing hypertension later in life or with future pregnancies. °RISK FACTORS °There are certain factors that make it more likely for you to develop hypertension during pregnancy. These include: °· Having hypertension before pregnancy. °· Having hypertension during a previous pregnancy. °· Being overweight. °· Being older than 40 years. °· Being pregnant with more than one baby. °· Having diabetes or kidney problems. °SIGNS AND SYMPTOMS °Chronic and gestational hypertension rarely cause symptoms. Preeclampsia has symptoms, which may include: °· Increased protein in your urine. Your health care provider will check for this at every prenatal visit. °· Swelling of your hands and face. °· Rapid weight gain. °· Headaches. °· Visual changes. °· Being  bothered by light. °· Abdominal pain, especially in the upper right area. °· Chest pain. °· Shortness of breath. °· Increased reflexes. °· Seizures. These occur with a more severe form of preeclampsia, called eclampsia. °DIAGNOSIS  °You may be diagnosed with hypertension during a regular prenatal exam. At each prenatal visit, you may have: °· Your blood pressure checked. °· A urine test to check for protein in your urine. °The type of hypertension you are diagnosed with depends on when you developed it. It also depends on your specific blood pressure reading. °· Developing hypertension before 20 weeks of pregnancy is consistent with chronic hypertension. °· Developing hypertension after 20 weeks of pregnancy is consistent with gestational hypertension. °· Hypertension with increased urinary protein is diagnosed as preeclampsia. °· Blood pressure measurements that stay above 160 systolic or 110 diastolic are a sign of severe preeclampsia. °TREATMENT °Treatment for hypertension during pregnancy varies. Treatment depends on the type of hypertension and how serious it is. °· If you take medicine for chronic hypertension, you may need to switch medicines. °¨ Medicines called ACE inhibitors should not be taken during pregnancy. °¨ Low-dose aspirin may be suggested for women who have risk factors for preeclampsia. °· If you have gestational hypertension, you may need to take a blood pressure medicine that is safe during pregnancy. Your health care provider will recommend the correct medicine. °· If you have severe preeclampsia, you may need to be in the hospital. Health care providers will watch you and your baby very closely. You also may need to take medicine called magnesium sulfate to prevent seizures and lower blood pressure. °· Sometimes, an early delivery is needed. This may be the case if the condition worsens. It would be   done to protect you and your baby. The only cure for preeclampsia is delivery. °· Your health  care provider may recommend that you take one low-dose aspirin (81 mg) each day to help prevent high blood pressure during your pregnancy if you are at risk for preeclampsia. You may be at risk for preeclampsia if: °¨ You had preeclampsia or eclampsia during a previous pregnancy. °¨ Your baby did not grow as expected during a previous pregnancy. °¨ You experienced preterm birth with a previous pregnancy. °¨ You experienced a separation of the placenta from the uterus (placental abruption) during a previous pregnancy. °¨ You experienced the loss of your baby during a previous pregnancy. °¨ You are pregnant with more than one baby. °¨ You have other medical conditions, such as diabetes or an autoimmune disease. °HOME CARE INSTRUCTIONS °· Schedule and keep all of your regular prenatal care appointments. This is important. °· Take medicines only as directed by your health care provider. Tell your health care provider about all medicines you take. °· Eat as little salt as possible. °· Get regular exercise. °· Do not drink alcohol. °· Do not use tobacco products. °· Do not drink products with caffeine. °· Lie on your left side when resting. °SEEK IMMEDIATE MEDICAL CARE IF: °· You have severe abdominal pain. °· You have sudden swelling in your hands, ankles, or face. °· You gain 4 pounds (1.8 kg) or more in 1 week. °· You vomit repeatedly. °· You have vaginal bleeding. °· You do not feel your baby moving as much. °· You have a headache. °· You have blurred or double vision. °· You have muscle twitching or spasms. °· You have shortness of breath. °· You have blue fingernails or lips. °· You have blood in your urine. °MAKE SURE YOU: °· Understand these instructions. °· Will watch your condition. °· Will get help right away if you are not doing well or get worse. °Document Released: 08/05/2011 Document Revised: 04/03/2014 Document Reviewed: 06/16/2013 °ExitCare® Patient Information ©2015 [employer]. This information is not  intended to replace advice given to you by your health care provider. Make sure you discuss any questions you have with your health care provider. ° °Preeclampsia and Eclampsia °Preeclampsia is a serious condition that develops only during pregnancy. It is also called toxemia of pregnancy. This condition causes high blood pressure along with other symptoms, such as swelling and headaches. These may develop as the condition gets worse. Preeclampsia may occur 20 weeks or later into your pregnancy.  °Diagnosing and treating preeclampsia early is very important. If not treated early, it can cause serious problems for you and your baby. One problem it can lead to is eclampsia, which is a condition that causes muscle jerking or shaking (convulsions) in the mother. Delivering your baby is the best treatment for preeclampsia or eclampsia.  °RISK FACTORS °The cause of preeclampsia is not known. You may be more likely to develop preeclampsia if you have certain risk factors. These include:  °· Being pregnant for the first time. °· Having preeclampsia in a past pregnancy. °· Having a family history of preeclampsia. °· Having high blood pressure. °· Being pregnant with twins or triplets. °· Being 35 or older. °· Being African American. °· Having kidney disease or diabetes. °· Having medical conditions such as lupus or blood diseases. °· Being very overweight (obese). °SIGNS AND SYMPTOMS  °The earliest signs of preeclampsia are: °· High blood pressure. °· Increased protein in your urine. Your health care   provider will check for this at every prenatal visit. °Other symptoms that can develop include:  °· Severe headaches. °· Sudden weight gain. °· Swelling of your hands, face, legs, and feet. °· Feeling sick to your stomach (nauseous) and throwing up (vomiting). °· Vision problems (blurred or double vision). °· Numbness in your face, arms, legs, and feet. °· Dizziness. °· Slurred speech. °· Sensitivity to bright  lights. °· Abdominal pain. °DIAGNOSIS  °There are no screening tests for preeclampsia. Your health care provider will ask you about symptoms and check for signs of preeclampsia during your prenatal visits. You may also have tests, including: °· Urine testing. °· Blood testing. °· Checking your baby's heart rate. °· Checking the health of your baby and your placenta using images created with sound waves (ultrasound). °TREATMENT  °You can work out the best treatment approach together with your health care provider. It is very important to keep all prenatal appointments. If you have an increased risk of preeclampsia, you may need more frequent prenatal exams. °· Your health care provider may prescribe bed rest. °· You may have to eat as little salt as possible. °· You may need to take medicine to lower your blood pressure if the condition does not respond to more conservative measures. °· You may need to stay in the hospital if your condition is severe. There, treatment will focus on controlling your blood pressure and fluid retention. You may also need to take medicine to prevent seizures. °· If the condition gets worse, your baby may need to be delivered early to protect you and the baby. You may have your labor started with medicine (be induced), or you may have a cesarean delivery. °· Preeclampsia usually goes away after the baby is born. °HOME CARE INSTRUCTIONS  °· Only take over-the-counter or prescription medicines as directed by your health care provider. °· Lie on your left side while resting. This keeps pressure off your baby. °· Elevate your feet while resting. °· Get regular exercise. Ask your health care provider what type of exercise is safe for you. °· Avoid caffeine and alcohol. °· Do not smoke. °· Drink 6-8 glasses of water every day. °· Eat a balanced diet that is low in salt. Do not add salt to your food. °· Avoid stressful situations as much as possible. °· Get plenty of rest and sleep. °· Keep all  prenatal appointments and tests as scheduled. °SEEK MEDICAL CARE IF: °· You are gaining more weight than expected. °· You have any headaches, abdominal pain, or nausea. °· You are bruising more than usual. °· You feel dizzy or light-headed. °SEEK IMMEDIATE MEDICAL CARE IF:  °· You develop sudden or severe swelling anywhere in your body. This usually happens in the legs. °· You gain 5 lb (2.3 kg) or more in a week. °· You have a severe headache, dizziness, problems with your vision, or confusion. °· You have severe abdominal pain. °· You have lasting nausea or vomiting. °· You have a seizure. °· You have trouble moving any part of your body. °· You develop numbness in your body. °· You have trouble speaking. °· You have any abnormal bleeding. °· You develop a stiff neck. °· You pass out. °MAKE SURE YOU:  °· Understand these instructions. °· Will watch your condition. °· Will get help right away if you are not doing well or get worse. °Document Released: 11/14/2000 Document Revised: 11/22/2013 Document Reviewed: 09/09/2013 °ExitCare® Patient Information ©2015 [employer]. This information is not   intended to replace advice given to you by your health care provider. Make sure you discuss any questions you have with your health care provider. ° °

## 2015-01-03 NOTE — MAU Provider Note (Signed)
Chief Complaint:  Hypertension   First Provider Initiated Contact with Patient 01/03/15 1312    HPI: Miranda Oliver is a 34 y.o. [id number] at 7647w1d who [facility]. Patient was sent from [facility] for further w/u of HTN. Patient denied any sxs of severe features. BPs were consistently elevated w/ ranges of 143-156/66-92 while in the MAU. CMP, CBC< and protein/creatinine ratio obtained. US obtained.  Denies contractions, leakage of fluid or vaginal bleeding. Good fetal movement.   Pregnancy Course:  [facility]  [place], 2nd baby, married  Dating By 7.6wk u/s  [place]  GC/CT Initial:    -/-            36+wks:  Genetic Screen NT/IT: negative  CF screen neg  Anatomic US Normal female  Flu vaccine 09/07/2014  Tdap Recommended ~ 28wks  Glucose Screen  2 hr  81/165/114  GBS   Feed Preference   Contraception   Circumcision   Childbirth Classes   Pediatrician      Past Medical History: Past Medical History  Diagnosis Date  . Anxiety   . GERD (gastroesophageal reflux disease)   . Depression     Past obstetric history: OB History  Gravida Para Term Preterm AB SAB TAB Ectopic Multiple Living  4 1 1  0 2 2 0 0 0 1    # Outcome Date GA Lbr Len/2nd Weight Sex Delivery Anes PTL Lv  4 Current           3 Term 11/18/06 1134w0d  3.09 kg (6 lb 13 oz) M Vag-Spont EPI  Y  2 SAB              Comments: D&C  1 SAB              Comments: D&C      Past Surgical History: Past Surgical History  Procedure Laterality Date  . Dilation and curettage of uterus  2013 & 2014     MAB x2      Family History: Family History  Problem Relation Age of Onset  . Stroke Mother   . Hypertension Mother   . Diabetes Mother   . Hypertension Sister   . Hypertension Father     Social History: History  Substance Use Topics  . Smoking status: Never Smoker   . Smokeless tobacco: Never Used  .  Alcohol Use: No     Comment: occassional     Allergies: No Known Allergies  Meds:  Prescriptions prior to admission  Medication Sig Dispense Refill Last Dose  . acetaminophen (TYLENOL) 500 MG tablet Take 1,000 mg by mouth every 6 (six) hours as needed for pain.   Past Month at Unknown time  . calcium carbonate (TUMS EX) 750 MG chewable tablet Chew 1 tablet by mouth daily.   Past Week at Unknown time  . Prenatal Vit-Fe Fumarate-FA (PRENATAL MULTIVITAMIN) TABS tablet Take 1 tablet by mouth daily at 12 noon.   01/03/2015 at Unknown time  . progesterone (PROMETRIUM) 200 MG capsule Place 1 cap in vagina at hs (Patient not taking: Reported on 01/03/2015) 30 capsule 3 Not Taking    ROS: Pertinent findings in history of present illness.  Physical Exam  Blood pressure 144/74, pulse 88, temperature 98.6 F (37 C), temperature source Oral, resp. rate 18, last menstrual period 03/29/2014. GENERAL: Well-developed, well-nourished female in no acute distress.  HEENT:  normocephalic, EOMI, MMM HEART: normal rate RESP: normal effort, CTAB ABDOMEN: Soft, non-tender, gravid appropriate for gestational age EXTREMITIES: Nontender, no significant edema; pulses present and equal bilaterally. NEURO: alert and oriented  FHT:  Baseline 145, moderate variability, accelerations present Contractions: none   Labs: Results for orders placed or performed during the hospital encounter of 01/03/15 (from the past 24 hour(s))  Protein / creatinine ratio, urine     Status: None   Collection Time: 01/03/15 12:45 PM  Result Value Ref Range   Creatinine, Urine 109.00 mg/dL   Total Protein, Urine 7 mg/dL   Protein Creatinine Ratio 0.06 0.00 - 0.15  CBC     Status: Abnormal   Collection Time: 01/03/15 12:50 PM  Result Value Ref Range   WBC 4.1 4.0 - 10.5 K/uL   RBC 3.69 (L) 3.87 - 5.11 MIL/uL   Hemoglobin 10.8 (L) 12.0 - 15.0 g/dL   HCT 16.1 (L) 09.6 - 04.5 %   MCV 88.1 78.0 - 100.0 fL   MCH 29.3 26.0 - 34.0 pg    MCHC 33.2 30.0 - 36.0 g/dL   RDW 40.9 81.1 - 91.4 %   Platelets 195 150 - 400 K/uL  Comprehensive metabolic panel     Status: Abnormal   Collection Time: 01/03/15 12:50 PM  Result Value Ref Range   Sodium 135 135 - 145 mmol/L   Potassium 3.4 (L) 3.5 - 5.1 mmol/L   Chloride 107 96 - 112 mmol/L   CO2 21 19 - 32 mmol/L   Glucose, Bld 133 (H) 70 - 99 mg/dL   BUN 9 6 - 23 mg/dL   Creatinine, Ser 7.82 0.50 - 1.10 mg/dL   Calcium 8.6 8.4 - 95.6 mg/dL   Total Protein 6.2 6.0 - 8.3 g/dL   Albumin 3.2 (L) 3.5 - 5.2 g/dL   AST 21 0 - 37 U/L   ALT 14 0 - 35 U/L   Alkaline Phosphatase 126 (H) 39 - 117 U/L   Total Bilirubin 0.3 0.3 - 1.2 mg/dL   GFR calc non Af Amer >90 >90 mL/min   GFR calc Af Amer >90 >90 mL/min   Anion gap 7 5 - 15    Imaging:  US Ob Detail + 14 Wk  01/03/2015   OBSTETRICAL ULTRASOUND: This exam was performed within a [facility]. The OB US report was generated in the [facility] system, and faxed to the ordering physician.   This report is available in the [employer]. See the [facility] Obstetric US report via the Image Link.  - Amniotic fluid normal  - Est. Fetal Weight in 81st percentile  - Fundal placenta  Assessment: 1. Gestational hypertension, third trimester   2. Gestational hypertension     Plan: Discharge home Labor precautions and fetal kick counts F[facility] for BP check  Follow-up Information    Follow up with [facility]. Schedule an appointment as soon as possible for a visit on 01/05/2015.   Specialty:  Obstetrics and Gynecology   Contact information:   [address] [place] [address] [phone]       Medication List    STOP taking these medications        progesterone 200 MG capsule  Commonly known as:  PROMETRIUM      TAKE these medications        acetaminophen 500 MG tablet  Commonly known as:  TYLENOL  Take 1,000 mg by mouth every 6 (six)  hours as needed for pain.     calcium  carbonate 750 MG chewable tablet  Commonly known as:  TUMS EX  Chew 1 tablet by mouth daily.     prenatal multivitamin Tabs tablet  Take 1 tablet by mouth daily at 12 noon.        Kathee Delton, MD 01/03/2015 4:35 PM  OB fellow attestation:  I have seen and examined this patient; I agree with above documentation in the resident's note.   Miranda Oliver is a 34 y.o. [phone] here for further evaluation of preEclampsia +FM, denies LOF, VB, contractions, vaginal discharge.  PE: BP 142/78 mmHg  Pulse 86  Temp(Src) 98.6 F (37 C) (Oral)  Resp 18  LMP 03/29/2014 Gen: calm comfortable, NAD Resp: normal effort, no distress Abd: gravid  ROS, labs, PMH reviewed NST reactive  Plan: ?gHTN vs cHTN, suspect chronic HTN given 2 BP elevated at 9w - fetal kick counts reinforced, discussed preeclampsia precautions - f/u in clinic on Friday for further evaluation. - no signs of preE at this time or need for induction/admission for 24h urine, 24h urine to be done as outpt - sono done today to r/o any IUGR or anomaly that would rule pt in for induction today.  Perry Mount, MD 5:06 PM

## 2015-01-03 NOTE — Progress Notes (Signed)
[id number] 6323w1d Estimated Date of Delivery: 01/30/15  Blood pressure 156/90, weight 119.296 kg (263 lb), last menstrual period 03/29/2014.   BP weight and urine results all reviewed and noted.  Please refer to the obstetrical flow sheet for the fundal height and fetal heart rate documentation:  Patient reports good fetal movement, denies any bleeding and no rupture of membranes symptoms or regular contractions. Patient is without complaints. Denies HA, Blurred vision, RUQ pain All questions were answered.  Plan:  To MAU for r/o PreX  Follow up in Friday for bp check if discharged

## 2015-01-05 ENCOUNTER — Encounter: Payer: Self-pay | Admitting: Obstetrics & Gynecology

## 2015-01-05 ENCOUNTER — Ambulatory Visit (INDEPENDENT_AMBULATORY_CARE_PROVIDER_SITE_OTHER): Payer: BLUE CROSS/BLUE SHIELD | Admitting: Obstetrics & Gynecology

## 2015-01-05 VITALS — BP 128/82 | Wt 263.0 lb

## 2015-01-05 DIAGNOSIS — Z331 Pregnant state, incidental: Secondary | ICD-10-CM

## 2015-01-05 DIAGNOSIS — Z1389 Encounter for screening for other disorder: Secondary | ICD-10-CM

## 2015-01-05 DIAGNOSIS — Z3483 Encounter for supervision of other normal pregnancy, third trimester: Secondary | ICD-10-CM

## 2015-01-05 LAB — POCT URINALYSIS DIPSTICK
Blood, UA: NEGATIVE
Glucose, UA: NEGATIVE
Ketones, UA: NEGATIVE
Leukocytes, UA: NEGATIVE
Nitrite, UA: NEGATIVE

## 2015-01-05 NOTE — Progress Notes (Signed)
[id number] 3442w3d Estimated Date of Delivery: 01/30/15  Blood pressure 128/82, weight 263 lb (119.296 kg), last menstrual period 03/29/2014.   BP weight and urine results all reviewed and noted.  Please refer to the obstetrical flow sheet for the fundal height and fetal heart rate documentation:  Patient reports good fetal movement, denies any bleeding and no rupture of membranes symptoms or regular contractions. Patient is without complaints. All questions were answered.  Plan:  Continued routine obstetrical care,   Follow up in 1 weeks for OB appointment, routine

## 2015-01-05 NOTE — Progress Notes (Signed)
Pt denies any problems or concerns at this time.  

## 2015-01-08 ENCOUNTER — Telehealth: Payer: Self-pay | Admitting: Women's Health

## 2015-01-08 NOTE — Telephone Encounter (Signed)
Pt c/o swelling in ankles with soreness. Pt states elevated lower extremities last night with some improvement. Pt informed to decrease her salt intake, push water, and elevates extremities as much as possible. If no improvement call our office back. Pt verbalized understanding.

## 2015-01-10 ENCOUNTER — Telehealth: Payer: Self-pay | Admitting: Advanced Practice Midwife

## 2015-01-12 ENCOUNTER — Encounter: Payer: Self-pay | Admitting: Obstetrics & Gynecology

## 2015-01-12 ENCOUNTER — Ambulatory Visit (INDEPENDENT_AMBULATORY_CARE_PROVIDER_SITE_OTHER): Payer: BLUE CROSS/BLUE SHIELD | Admitting: Obstetrics & Gynecology

## 2015-01-12 VITALS — BP 152/90 | Wt 261.0 lb

## 2015-01-12 DIAGNOSIS — Z331 Pregnant state, incidental: Secondary | ICD-10-CM

## 2015-01-12 DIAGNOSIS — Z1389 Encounter for screening for other disorder: Secondary | ICD-10-CM

## 2015-01-12 DIAGNOSIS — Z118 Encounter for screening for other infectious and parasitic diseases: Secondary | ICD-10-CM

## 2015-01-12 DIAGNOSIS — Z029 Encounter for administrative examinations, unspecified: Secondary | ICD-10-CM

## 2015-01-12 DIAGNOSIS — Z1159 Encounter for screening for other viral diseases: Secondary | ICD-10-CM

## 2015-01-12 DIAGNOSIS — Z3685 Encounter for antenatal screening for Streptococcus B: Secondary | ICD-10-CM

## 2015-01-12 DIAGNOSIS — Z3483 Encounter for supervision of other normal pregnancy, third trimester: Secondary | ICD-10-CM

## 2015-01-12 LAB — POCT URINALYSIS DIPSTICK
Blood, UA: NEGATIVE
Glucose, UA: NEGATIVE
Ketones, UA: NEGATIVE
Leukocytes, UA: NEGATIVE
Nitrite, UA: NEGATIVE

## 2015-01-12 LAB — OB RESULTS CONSOLE GBS: GBS: NEGATIVE

## 2015-01-12 NOTE — Progress Notes (Signed)
BP noted has been in that range previously, she has borderline  Follow up Tuesday for NST and visit  G4P1021 5331w3d Estimated Date of Delivery: 01/30/15  Blood pressure 152/90, weight 261 lb (118.389 kg), last menstrual period 03/29/2014.   BP weight and urine results all reviewed and noted.  Please refer to the obstetrical flow sheet for the fundal height and fetal heart rate documentation:  Patient reports good fetal movement, denies any bleeding and no rupture of membranes symptoms or regular contractions. Patient is without complaints. All questions were answered.  Plan:  Continued routine obstetrical care,   Follow up in Tuesday weeks for OB appointment, NST

## 2015-01-16 ENCOUNTER — Ambulatory Visit (INDEPENDENT_AMBULATORY_CARE_PROVIDER_SITE_OTHER): Payer: BLUE CROSS/BLUE SHIELD | Admitting: Obstetrics & Gynecology

## 2015-01-16 ENCOUNTER — Inpatient Hospital Stay (HOSPITAL_COMMUNITY)
Admission: AD | Admit: 2015-01-16 | Discharge: 2015-01-18 | DRG: 775 | Disposition: A | Discharge status: Home / Self Care | Payer: BLUE CROSS/BLUE SHIELD | Source: Ambulatory Visit | Attending: Obstetrics & Gynecology | Admitting: Obstetrics & Gynecology

## 2015-01-16 ENCOUNTER — Inpatient Hospital Stay (HOSPITAL_COMMUNITY): Payer: BLUE CROSS/BLUE SHIELD | Admitting: Anesthesiology

## 2015-01-16 VITALS — BP 158/98 | Wt 258.0 lb

## 2015-01-16 DIAGNOSIS — Z3A38 38 weeks gestation of pregnancy: Secondary | ICD-10-CM | POA: Diagnosis present

## 2015-01-16 DIAGNOSIS — O24429 Gestational diabetes mellitus in childbirth, unspecified control: Secondary | ICD-10-CM | POA: Diagnosis present

## 2015-01-16 DIAGNOSIS — O0993 Supervision of high risk pregnancy, unspecified, third trimester: Secondary | ICD-10-CM | POA: Diagnosis not present

## 2015-01-16 DIAGNOSIS — O133 Gestational [pregnancy-induced] hypertension without significant proteinuria, third trimester: Secondary | ICD-10-CM | POA: Diagnosis not present

## 2015-01-16 DIAGNOSIS — Z1389 Encounter for screening for other disorder: Secondary | ICD-10-CM

## 2015-01-16 DIAGNOSIS — Z3490 Encounter for supervision of normal pregnancy, unspecified, unspecified trimester: Secondary | ICD-10-CM

## 2015-01-16 DIAGNOSIS — Z331 Pregnant state, incidental: Secondary | ICD-10-CM

## 2015-01-16 LAB — CBC
HCT: 34.5 % — ABNORMAL LOW (ref 36.0–46.0)
HCT: 34.5 % — ABNORMAL LOW (ref 36.0–46.0)
Hemoglobin: 11.8 g/dL — ABNORMAL LOW (ref 12.0–15.0)
Hemoglobin: 11.4 g/dL — ABNORMAL LOW (ref 12.0–15.0)
MCH: 30 pg (ref 26.0–34.0)
MCH: 29.3 pg (ref 26.0–34.0)
MCHC: 34.2 g/dL (ref 30.0–36.0)
MCHC: 33 g/dL (ref 30.0–36.0)
MCV: 87.8 fL (ref 78.0–100.0)
MCV: 88.7 fL (ref 78.0–100.0)
Platelets: 221 10*3/uL (ref 150–400)
Platelets: 215 10*3/uL (ref 150–400)
RBC: 3.93 MIL/uL (ref 3.87–5.11)
RBC: 3.89 MIL/uL (ref 3.87–5.11)
RDW: 13.5 % (ref 11.5–15.5)
RDW: 13.6 % (ref 11.5–15.5)
WBC: 4.1 10*3/uL (ref 4.0–10.5)
WBC: 5.5 10*3/uL (ref 4.0–10.5)

## 2015-01-16 LAB — COMPREHENSIVE METABOLIC PANEL
ALT: 15 U/L (ref 0–35)
AST: 21 U/L (ref 0–37)
Albumin: 3.2 g/dL — ABNORMAL LOW (ref 3.5–5.2)
Alkaline Phosphatase: 159 U/L — ABNORMAL HIGH (ref 39–117)
Anion gap: 4 — ABNORMAL LOW (ref 5–15)
BUN: 11 mg/dL (ref 6–23)
CO2: 23 mmol/L (ref 19–32)
Calcium: 9 mg/dL (ref 8.4–10.5)
Chloride: 109 mmol/L (ref 96–112)
Creatinine, Ser: 0.64 mg/dL (ref 0.50–1.10)
GFR calc Af Amer: >90 mL/min (ref >90)
GFR calc non Af Amer: >90 mL/min (ref >90)
Glucose, Bld: 79 mg/dL (ref 70–99)
Potassium: 4.2 mmol/L (ref 3.5–5.1)
Sodium: 136 mmol/L (ref 135–145)
Total Bilirubin: 0.3 mg/dL (ref 0.3–1.2)
Total Protein: 6.5 g/dL (ref 6.0–8.3)

## 2015-01-16 LAB — POCT URINALYSIS DIPSTICK
Blood, UA: NEGATIVE
Glucose, UA: NEGATIVE
Ketones, UA: NEGATIVE
Leukocytes, UA: NEGATIVE
Nitrite, UA: NEGATIVE

## 2015-01-16 LAB — PROTEIN / CREATININE RATIO, URINE
Creatinine, Urine: 273 mg/dL
Protein Creatinine Ratio: 0.27 — ABNORMAL HIGH (ref 0.00–0.15)
Total Protein, Urine: 74 mg/dL

## 2015-01-16 LAB — CULTURE, BETA STREP (GROUP B ONLY): Strep Gp B Culture: NEGATIVE

## 2015-01-16 LAB — TYPE AND SCREEN
ABO/RH(D): O POS
Antibody Screen: NEGATIVE

## 2015-01-16 LAB — ABO/RH: ABO/RH(D): O POS

## 2015-01-16 MED ORDER — LACTATED RINGERS IV SOLN
500.0000 mL | Freq: Once | INTRAVENOUS | Status: AC
  Administered 2015-01-16: 500 mL via INTRAVENOUS

## 2015-01-16 MED ORDER — EPHEDRINE 5 MG/ML INJ
10.0000 mg | INTRAVENOUS | Status: DC | PRN
  Filled 2015-01-16: qty 2

## 2015-01-16 MED ORDER — DIPHENHYDRAMINE HCL 50 MG/ML IJ SOLN: 12.5000 mg | INTRAMUSCULAR | Status: DC | PRN

## 2015-01-16 MED ORDER — LACTATED RINGERS IV SOLN
INTRAVENOUS | Status: DC
  Administered 2015-01-16 – 2015-01-17 (×2): via INTRAVENOUS

## 2015-01-16 MED ORDER — ONDANSETRON HCL 4 MG/2ML IJ SOLN
4.0000 mg | Freq: Four times a day (QID) | INTRAMUSCULAR | Status: DC | PRN
  Administered 2015-01-17: 4 mg via INTRAVENOUS
  Filled 2015-01-16 (×2): qty 2

## 2015-01-16 MED ORDER — OXYTOCIN BOLUS FROM INFUSION: 500.0000 mL | INTRAVENOUS | Status: DC

## 2015-01-16 MED ORDER — ACETAMINOPHEN 325 MG PO TABS: 650.0000 mg | ORAL_TABLET | ORAL | Status: DC | PRN

## 2015-01-16 MED ORDER — CITRIC ACID-SODIUM CITRATE 334-500 MG/5ML PO SOLN
30.0000 mL | ORAL | Status: DC | PRN
  Administered 2015-01-17: 30 mL via ORAL
  Filled 2015-01-16: qty 15

## 2015-01-16 MED ORDER — OXYCODONE-ACETAMINOPHEN 5-325 MG PO TABS
1.0000 | ORAL_TABLET | ORAL | Status: DC | PRN
Start: 2015-01-16 — End: 2015-01-18

## 2015-01-16 MED ORDER — OXYTOCIN 40 UNITS IN LACTATED RINGERS INFUSION - SIMPLE MED
62.5000 mL/h | INTRAVENOUS | Status: DC
  Filled 2015-01-16: qty 1000

## 2015-01-16 MED ORDER — KETOROLAC TROMETHAMINE 15 MG/ML IJ SOLN: 15.0000 mg | Freq: Four times a day (QID) | INTRAMUSCULAR | Status: DC | PRN

## 2015-01-16 MED ORDER — TERBUTALINE SULFATE 1 MG/ML IJ SOLN: 0.2500 mg | Freq: Once | INTRAMUSCULAR | Status: AC | PRN

## 2015-01-16 MED ORDER — LACTATED RINGERS IV SOLN: 500.0000 mL | INTRAVENOUS | Status: DC | PRN

## 2015-01-16 MED ORDER — HYDRALAZINE HCL 20 MG/ML IJ SOLN: 5.0000 mg | INTRAMUSCULAR | Status: DC | PRN

## 2015-01-16 MED ORDER — PHENYLEPHRINE 40 MCG/ML (10ML) SYRINGE FOR IV PUSH (FOR BLOOD PRESSURE SUPPORT)
80.0000 ug | PREFILLED_SYRINGE | INTRAVENOUS | Status: DC | PRN
  Filled 2015-01-16: qty 2

## 2015-01-16 MED ORDER — LABETALOL HCL 5 MG/ML IV SOLN
20.0000 mg | INTRAVENOUS | Status: DC | PRN
Start: 2015-01-16 — End: 2015-01-18

## 2015-01-16 MED ORDER — LIDOCAINE HCL (PF) 1 % IJ SOLN
INTRAMUSCULAR | Status: DC | PRN
  Administered 2015-01-16 (×2): 5 mL

## 2015-01-16 MED ORDER — LIDOCAINE HCL (PF) 1 % IJ SOLN
30.0000 mL | INTRAMUSCULAR | Status: DC | PRN
  Filled 2015-01-16: qty 30

## 2015-01-16 MED ORDER — BUTORPHANOL TARTRATE 1 MG/ML IJ SOLN: 0.5000 mg | INTRAMUSCULAR | Status: DC | PRN

## 2015-01-16 MED ORDER — FLEET ENEMA 7-19 GM/118ML RE ENEM: 1.0000 | ENEMA | RECTAL | Status: DC | PRN

## 2015-01-16 MED ORDER — OXYCODONE-ACETAMINOPHEN 5-325 MG PO TABS
2.0000 | ORAL_TABLET | ORAL | Status: DC | PRN
Start: 2015-01-16 — End: 2015-01-18

## 2015-01-16 MED ORDER — PHENYLEPHRINE 40 MCG/ML (10ML) SYRINGE FOR IV PUSH (FOR BLOOD PRESSURE SUPPORT)
80.0000 ug | PREFILLED_SYRINGE | INTRAVENOUS | Status: DC | PRN
  Filled 2015-01-16: qty 2
  Filled 2015-01-16: qty 20

## 2015-01-16 MED ORDER — FENTANYL CITRATE 0.05 MG/ML IJ SOLN
100.0000 ug | INTRAMUSCULAR | Status: DC | PRN
  Administered 2015-01-16 (×2): 100 ug via INTRAVENOUS
  Filled 2015-01-16 (×2): qty 2

## 2015-01-16 MED ORDER — FENTANYL 2.5 MCG/ML BUPIVACAINE 1/10 % EPIDURAL INFUSION (WH - ANES)
14.0000 mL/h | INTRAMUSCULAR | Status: DC | PRN
  Administered 2015-01-16 – 2015-01-17 (×2): 14 mL/h via EPIDURAL
  Filled 2015-01-16 (×2): qty 125

## 2015-01-16 MED ORDER — MISOPROSTOL 50MCG HALF TABLET
50.0000 ug | ORAL_TABLET | ORAL | Status: DC | PRN
  Administered 2015-01-16: 50 ug via ORAL
  Filled 2015-01-16 (×2): qty 1

## 2015-01-16 NOTE — Progress Notes (Signed)
Reactive NST   High Risk Pregnancy Diagnosis(es):   Gestational Hypertension  G4P1021 322w0d Estimated Date of Delivery: 01/30/15  Blood pressure 158/98, weight 258 lb (117.028 kg), last menstrual period 03/29/2014.  Urinalysis: Negative   HPI: Pt is without complaints, no CNS symptoms     BP weight and urine results all reviewed and noted. Patient reports good fetal movement, denies any bleeding and no rupture of membranes symptoms or regular contractions.  Fundal Height:  40 Fetal Heart rate:  135 Edema:  trace  Patient is without complaints. All questions were answered.  Assessment:  382w0d,   Gestational hypertension  Medication(s) Plans:  No changes  Treatment Plan:  With BP increasingly an issue, do not think that anti hypertensive therapy is appropriate to initiate at this point.  It is hard to determine if this is indeed borderline chronic or purely gestationahypertension or bothl.  In a any event at this gestational age,  the discussion is academic and delivery in this setting is the appropriate management plan.  Dr Adrian BlackwaterStinson is aware and agrees with plan, cervix is unfavorable will need cytotec and foley.  Pt aware of cervical ripening time requirements  Follow up in 1 weeks for appointment for high risk OB care, BP check

## 2015-01-16 NOTE — Anesthesia Procedure Notes (Signed)
Epidural Patient location during procedure: OB Start time: 01/16/2015 11:13 PM  Staffing Anesthesiologist: Brayton CavesJACKSON, Zaylin Runco Performed by: anesthesiologist   Preanesthetic Checklist Completed: patient identified, site marked, surgical consent, pre-op evaluation, timeout performed, IV checked, risks and benefits discussed and monitors and equipment checked  Epidural Patient position: sitting Prep: site prepped and draped and DuraPrep Patient monitoring: continuous pulse ox and blood pressure Approach: midline Location: L3-L4 Injection technique: LOR air  Needle:  Needle type: Tuohy  Needle gauge: 17 G Needle length: 9 cm and 9 Needle insertion depth: 7 cm Catheter type: closed end flexible Catheter size: 19 Gauge Catheter at skin depth: 12 cm Test dose: negative  Assessment Events: blood not aspirated, injection not painful, no injection resistance, negative IV test and no paresthesia  Additional Notes Patient identified.  Risk benefits discussed including failed block, incomplete pain control, headache, nerve damage, paralysis, blood pressure changes, nausea, vomiting, reactions to medication both toxic or allergic, and postpartum back pain.  Patient expressed understanding and wished to proceed.  All questions were answered.  Sterile technique used throughout procedure and epidural site dressed with sterile barrier dressing. No paresthesia or other complications noted.The patient did not experience any signs of intravascular injection such as tinnitus or metallic taste in mouth nor signs of intrathecal spread such as rapid motor block. Please see nursing notes for vital signs.

## 2015-01-16 NOTE — H&P (Signed)
LABOR ADMISSION HISTORY AND PHYSICAL  Miranda Oliver is a 34 y.o. female [phone] with IUP at [redacted]w[redacted]d by 7.6week ultrasound presenting for induction of labor secondary to gestational hypertension. Denies headache or blurred vision. She reports +FMs, No LOF, no VB, no blurry vision, headaches or peripheral edema, and RUQ pain.  She plans on breast feeding. She request bilateral tubal ligation for birth control.  Dating: By 7.6 weeks ultrasound --->  Estimated Date of Delivery: 01/30/15  Prenatal History/Complications:  Past Medical History: Past Medical History  Diagnosis Date  . Anxiety   . GERD (gastroesophageal reflux disease)   . Depression     Past Surgical History: Past Surgical History  Procedure Laterality Date  . Dilation and curettage of uterus  2013 & 2014     MAB x2     Obstetrical History: OB History    Gravida Para Term Preterm AB TAB SAB Ectopic Multiple Living   0 2 0 2 0 0 1      Social History: History   Social History  . Marital Status: Married    Spouse Name: N/A  . Number of Children: N/A  . Years of Education: N/A   Social History Main Topics  . Smoking status: Never Smoker   . Smokeless tobacco: Never Used  . Alcohol Use: No     Comment: occassional   . Drug Use: No  . Sexual Activity: Yes    Birth Control/ Protection: None   Other Topics Concern  . Not on file   Social History Narrative    Family History: Family History  Problem Relation Age of Onset  . Stroke Mother   . Hypertension Mother   . Diabetes Mother   . Hypertension Sister   . Hypertension Father     Allergies: No Known Allergies  Prescriptions prior to admission  Medication Sig Dispense Refill Last Dose  . acetaminophen (TYLENOL) 500 MG tablet Take 1,000 mg by mouth every 6 (six) hours as needed for pain.   Taking  . calcium carbonate (TUMS EX) 750 MG chewable tablet Chew 1 tablet by mouth daily.   Taking  . Prenatal Vit-Fe Fumarate-FA (PRENATAL MULTIVITAMIN)  TABS tablet Take 1 tablet by mouth daily at 12 noon.   Taking   Review of Systems   All systems reviewed and negative except as stated in HPI  Filed Vitals:   01/16/15 1554 01/16/15 1557 01/16/15 1615 01/16/15 1631  BP: 155/93  156/68   Pulse: 88  80   Resp: 18   18  Height:   (1.575 m)    Weight:  258 lb (117.028 kg)     Last menstrual period 03/29/2014. General appearance: alert, cooperative, appears stated age and no distress Lungs: no increased work of breathing noted Abdomen: soft, non-tender; bowel sounds normal Extremities: Homans sign is negative, no sign of DVT DTR's normal Presentation: cephalic Fetal monitoringBaseline: 135 bpm, Variability: Good {> 6 bpm), Accelerations: Reactive and Decelerations: Absent Uterine activity: None     Prenatal labs: ABO, Rh: O/POS/-- (07/28 1425) Antibody: NEG (12/03 0928) Rubella:   RPR: NON REAC (12/03 0928)  HBsAg: NEGATIVE (07/28 1425)  HIV: NONREACTIVE (12/03 0928)  GBS: Negative (02/12 0000)   [facility]  FOB [place], [place], 2nd baby, married  Dating By 7.6wk u/s  Pap 2013 neg in [place]  GC/CT Initial:    -/-            36+wks:  Genetic Screen NT/IT:  negative  CF screen neg  Anatomic US Normal female  Flu vaccine 09/07/2014  Tdap Recommended ~ 28wks  Glucose Screen  2 hr  81/165/114  GBS  Negative  Feed Preference  Breast  Contraception  Bilateral Tubal Ligation  Circumcision  N/A  Childbirth Classes   Pediatrician      Results for orders placed or performed in visit on 01/16/15 (from the past 24 hour(s))  POCT urinalysis dipstick   Collection Time: 01/16/15  9:41 AM  Result Value Ref Range   Color, UA yellow    Clarity, UA clear    Glucose, UA neg    Bilirubin, UA     Ketones, UA neg    Spec Grav, UA     Blood, UA neg    pH, UA     Protein, UA trace    Urobilinogen, UA     Nitrite, UA neg    Leukocytes, UA Negative     Patient Active Problem List   Diagnosis Date Noted  .  Pregnancy 01/16/2015  . Chronic hypertension 01/03/2015  . Gestational hypertension   . Encounter for fetal anatomic survey   . [redacted] weeks gestation of pregnancy   . Supervision of other normal pregnancy 06/27/2014  . Obesity 06/27/2014  . General counseling and advice for procreative management 02/15/2014    Assessment: Miranda Oliver is a 34 y.o. G4P1021 at 3279w0d here for induction of labor secondary to gestational HTN  #Labor: Induction. Cytotec and Foley Bulb. Monitor q4 hr or sooner if clinically indicated #Pain: Acetaminophen. Percocet q4hr PRN. Consider Epidural. #FWB: 135 bpm, Variability: Good {> 6 bpm), Accelerations: Reactive and Decelerations: Absent #ID:  GBS negative #MOF: Attempting Breast. Considering bottle if uncomfortably with breast prior to discharge #MOC: Bilateral Tubal Ligation #Circ:  N/A  Araceli BoucheRumley, Meriden N 01/16/2015, 3:51 PM  OB fellow attestation:  I have seen and examined this patient; I agree with above documentation in the resident's note.   Miranda Oliver is a 34 y.o. [phone] here for IOL 2/2 gHTN, also has cHTN  PE: BP 129/66 mmHg  Pulse 77  Temp(Src) 98.5 F (36.9 C) (Oral)  Resp 20  Ht 5\' 2"  (1.575 m)  Wt 258 lb (117.028 kg)  BMI 47.18 kg/m2  LMP 03/29/2014 Gen: calm comfortable, NAD Resp: normal effort, no distress Abd: gravid  ROS, labs, PMH reviewed  Plan: MOF: bottle and ?breast MOC: BTL ID: GBS neg FWB: cat I Labor: FB placed, cytotec PO as well Pain: epidural upon request  Deitra Craine ROCIO 01/16/2015, 8:09 PM

## 2015-01-16 NOTE — Anesthesia Preprocedure Evaluation (Signed)
Anesthesia Evaluation  Patient identified by MRN, date of birth, ID band Patient awake    Reviewed: Allergy & Precautions, H&P , Patient's Chart, lab work & pertinent test results  Airway Mallampati: II  TM Distance: >3 FB Neck ROM: full    Dental   Pulmonary  breath sounds clear to auscultation        Cardiovascular hypertension, Rhythm:regular Rate:Normal     Neuro/Psych PSYCHIATRIC DISORDERS    GI/Hepatic GERD-  ,  Endo/Other  Morbid obesity  Renal/GU      Musculoskeletal   Abdominal   Peds  Hematology   Anesthesia Other Findings   Reproductive/Obstetrics (+) Pregnancy                             Anesthesia Physical Anesthesia Plan  ASA: III  Anesthesia Plan: Epidural   Post-op Pain Management:    Induction:   Airway Management Planned:   Additional Equipment:   Intra-op Plan:   Post-operative Plan:   Informed Consent: I have reviewed the patients History and Physical, chart, labs and discussed the procedure including the risks, benefits and alternatives for the proposed anesthesia with the patient or authorized representative who has indicated his/her understanding and acceptance.     Plan Discussed with:   Anesthesia Plan Comments:         Anesthesia Quick Evaluation

## 2015-01-17 ENCOUNTER — Encounter (HOSPITAL_COMMUNITY): Payer: Self-pay | Admitting: *Deleted

## 2015-01-17 LAB — CBC
HCT: 33.6 % — ABNORMAL LOW (ref 36.0–46.0)
Hemoglobin: 11.3 g/dL — ABNORMAL LOW (ref 12.0–15.0)
MCH: 29.9 pg (ref 26.0–34.0)
MCHC: 33.6 g/dL (ref 30.0–36.0)
MCV: 88.9 fL (ref 78.0–100.0)
Platelets: 188 10*3/uL (ref 150–400)
RBC: 3.78 MIL/uL — ABNORMAL LOW (ref 3.87–5.11)
RDW: 13.8 % (ref 11.5–15.5)
WBC: 6.3 10*3/uL (ref 4.0–10.5)

## 2015-01-17 LAB — GC/CHLAMYDIA PROBE AMP
Chlamydia trachomatis, NAA: NEGATIVE
Neisseria gonorrhoeae by PCR: NEGATIVE

## 2015-01-17 LAB — RPR: RPR Ser Ql: NONREACTIVE

## 2015-01-17 MED ORDER — PROMETHAZINE HCL 25 MG/ML IJ SOLN: 12.5000 mg | Freq: Four times a day (QID) | INTRAMUSCULAR | Status: DC | PRN

## 2015-01-17 MED ORDER — TETANUS-DIPHTH-ACELL PERTUSSIS 5-2.5-18.5 LF-MCG/0.5 IM SUSP
0.5000 mL | Freq: Once | INTRAMUSCULAR | Status: AC
  Administered 2015-01-18: 0.5 mL via INTRAMUSCULAR
  Filled 2015-01-17: qty 0.5

## 2015-01-17 MED ORDER — DIBUCAINE 1 % RE OINT: 1.0000 "application " | TOPICAL_OINTMENT | RECTAL | Status: DC | PRN

## 2015-01-17 MED ORDER — IBUPROFEN 600 MG PO TABS
600.0000 mg | ORAL_TABLET | Freq: Four times a day (QID) | ORAL | Status: DC
  Administered 2015-01-17 – 2015-01-18 (×4): 600 mg via ORAL
  Filled 2015-01-17 (×5): qty 1

## 2015-01-17 MED ORDER — ONDANSETRON HCL 4 MG/2ML IJ SOLN
4.0000 mg | INTRAMUSCULAR | Status: DC | PRN
Start: 2015-01-17 — End: 2015-01-18

## 2015-01-17 MED ORDER — PRENATAL MULTIVITAMIN CH
1.0000 | ORAL_TABLET | Freq: Every day | ORAL | Status: DC
  Administered 2015-01-18: 1 via ORAL
  Filled 2015-01-17: qty 1

## 2015-01-17 MED ORDER — BENZOCAINE-MENTHOL 20-0.5 % EX AERO: 1.0000 "application " | INHALATION_SPRAY | CUTANEOUS | Status: DC | PRN

## 2015-01-17 MED ORDER — ONDANSETRON HCL 4 MG PO TABS: 4.0000 mg | ORAL_TABLET | ORAL | Status: DC | PRN

## 2015-01-17 MED ORDER — LANOLIN HYDROUS EX OINT: TOPICAL_OINTMENT | CUTANEOUS | Status: DC | PRN

## 2015-01-17 MED ORDER — DIPHENHYDRAMINE HCL 25 MG PO CAPS: 25.0000 mg | ORAL_CAPSULE | Freq: Four times a day (QID) | ORAL | Status: DC | PRN

## 2015-01-17 MED ORDER — WITCH HAZEL-GLYCERIN EX PADS: 1.0000 "application " | MEDICATED_PAD | CUTANEOUS | Status: DC | PRN

## 2015-01-17 MED ORDER — OXYTOCIN 40 UNITS IN LACTATED RINGERS INFUSION - SIMPLE MED
1.0000 m[IU]/min | INTRAVENOUS | Status: DC
  Administered 2015-01-17: 666 m[IU]/min via INTRAVENOUS
  Administered 2015-01-17: 1 m[IU]/min via INTRAVENOUS

## 2015-01-17 MED ORDER — TERBUTALINE SULFATE 1 MG/ML IJ SOLN
0.2500 mg | Freq: Once | INTRAMUSCULAR | Status: AC | PRN
  Filled 2015-01-17: qty 1

## 2015-01-17 MED ORDER — SENNOSIDES-DOCUSATE SODIUM 8.6-50 MG PO TABS
2.0000 | ORAL_TABLET | ORAL | Status: DC
  Administered 2015-01-18: 2 via ORAL
  Filled 2015-01-17: qty 2

## 2015-01-17 MED ORDER — SIMETHICONE 80 MG PO CHEW: 80.0000 mg | CHEWABLE_TABLET | ORAL | Status: DC | PRN

## 2015-01-17 MED ORDER — ZOLPIDEM TARTRATE 5 MG PO TABS: 5.0000 mg | ORAL_TABLET | Freq: Every evening | ORAL | Status: DC | PRN

## 2015-01-17 NOTE — Progress Notes (Addendum)
S: Notes increase in uterine pressure and contractions. Not feeling urge to push yet.  O: Dilation: 8 Effacement (%): 90 Cervical Position: Middle Station: 0 Presentation: Vertex Exam by:: Herma CarsonLindsay Lima, rn  A/P: Active Labor Will continue to monitor  Dr. Garry Heateraleigh Kingston Guiles, DO PGY-1 Family Medicine

## 2015-01-17 NOTE — Lactation Note (Signed)
This note was copied from the chart of Miranda Minus LibertyKimberly Fonda. Lactation Consultation Note Initial visit at 9 hours of age.  Mom reports a good 25 minute feeding and then another for about 7 minutes.  Baby has been sleepy since.  Tech at bedside to do bath.  After bath baby STS on left breast.  Mom used hand pump to help evert nipple with some eversion and colostrum visible.  Baby does not show any interest in latching.  Place on moms breast STS and encouraged mom to offer if baby shows feeding cues. [facility] resources given and discussed.  Encouraged to feed with early cues on demand.  Early newborn behavior discussed.  Hand expression demonstrated by mom with colostrum visible.  Mom to call for assist as needed.    Patient Name: Miranda Oliver [id number]'WToday's Date: 01/17/2015 Reason for consult: Initial assessment   Maternal Data Has patient been taught Hand Expression?: Yes Does the patient have breastfeeding experience prior to this delivery?: No  Feeding Feeding Type: Breast Fed  LATCH Score/Interventions Latch: Too sleepy or reluctant, no latch achieved, no sucking elicited.  Audible Swallowing: None  Type of Nipple: Flat (left nipple flat right nipple everted) Intervention(s): Hand pump  Comfort (Breast/Nipple): Soft / non-tender     Hold (Positioning): No assistance needed to correctly position infant at breast. Intervention(s): Breastfeeding basics reviewed;Support Pillows;Position options;Skin to skin  LATCH Score: 5  Lactation Tools Discussed/Used Date initiated:: 01/17/15   Consult Status Consult Status: Follow-up Date: 01/18/15 Follow-up type: In-patient    Shoptaw, Arvella MerlesJana Lynn 01/17/2015, 9:22 PM

## 2015-01-17 NOTE — Progress Notes (Signed)
Miranda Oliver is a 34 y.o. [phone] at 552w1d admitted for induction of labor due to Hypertension.  Subjective: Feels well since epidural.   Objective: BP 158/87 mmHg  Pulse 82  Temp(Src) 98.5 F (36.9 C) (Oral)  Resp 20  Ht 5\' 2"  (1.575 m)  Wt 117.028 kg (258 lb)  BMI 47.18 kg/m2  SpO2 99%  LMP 03/29/2014    FHT:  FHR: 130 bpm, variability: moderate,  accelerations:  Present,  decelerations:  Absent UC:   regular, every 3.5 minutes  SVE:   Dilation: 4.5 Effacement (%): 50 Station: -2 Exam by:: Honeycutt, RN   Labs: Lab Results  Component Value Date   WBC 5.5 01/16/2015   HGB 11.4* 01/16/2015   HCT 34.5* 01/16/2015   MCV 88.7 01/16/2015   PLT 215 01/16/2015    Assessment / Plan: Induction of labor due to cHTN vs gHTN.   Labor: s/p foley bulb. Has received Epidural. Will start pit 1/1.  Fetal Wellbeing:  Category I Pain Control:  Epidural Pre-eclampsia: n/a  I/D:  GBS neg  Anticipated MOD:  NSVD  Myra RudeSchmitz, Girard Koontz E CNM, WHNP-BC 01/17/2015, 12:50 AM

## 2015-01-18 LAB — CBC
HCT: 30.7 % — ABNORMAL LOW (ref 36.0–46.0)
Hemoglobin: 10.3 g/dL — ABNORMAL LOW (ref 12.0–15.0)
MCH: 30 pg (ref 26.0–34.0)
MCHC: 33.6 g/dL (ref 30.0–36.0)
MCV: 89.5 fL (ref 78.0–100.0)
Platelets: 181 10*3/uL (ref 150–400)
RBC: 3.43 MIL/uL — ABNORMAL LOW (ref 3.87–5.11)
RDW: 13.7 % (ref 11.5–15.5)
WBC: 9.1 10*3/uL (ref 4.0–10.5)

## 2015-01-18 NOTE — Progress Notes (Signed)
Clinical Social Work Department PSYCHOSOCIAL ASSESSMENT - MATERNAL/CHILD 01/18/2015  Patient:  Miranda Oliver, Miranda Oliver  Account Number:  [id number]  Admit Date:  01/16/2015  Miranda Oliver   Clinical Social Worker:  Lucita Ferrara, CLINICAL SOCIAL WORKER   Date/Time:  01/18/2015 09:15 AM  Date Referred:  01/17/2015   Referral source  Central Nursery     Referred reason  Depression/Anxiety   Other referral source:    I:  FAMILY / HOME ENVIRONMENT Child's legal guardian:  PARENT  Guardian - Name Guardian - Age Guardian - Address  Miranda Oliver [address] [place], [place] [address]  Miranda Oliver 89 same residence   Other household support members/support persons Name Relationship DOB   SON 61 years old   Other support:   Miranda Oliver endorsed strong family support.    II  PSYCHOSOCIAL DATA Information Source:  Patient Interview  Occupational hygienist Employment:   Did not disclose.   Financial resources:  Multimedia programmer If [place]:  [employer] Other  [place] / Grade:  N/A Music therapist / Child Services Coordination / Early Interventions:   None reported  Cultural issues impacting care:   None reported   III  STRENGTHS Strengths  Adequate Resources  Home prepared for Child (including basic supplies)  Supportive family/friends   Strength comment:    IV  RISK FACTORS AND CURRENT PROBLEMS Current Problem:  YES   Risk Factor & Current Problem Patient Issue Family Issue Risk Factor / Current Problem Comment  Mental Illness Y N Miranda Oliver presents with history of anxiety and depression status-post two miscarriages.  Miranda Oliver has previously been prescribed Prozac and Klonopin, but has not taken any medication since prior to the pregnancy.    V  SOCIAL WORK ASSESSMENT CSW met with the Miranda Oliver due to maternal history of depression and anxiety.  Miranda Oliver presented in a pleasant mood, displayed an appropriate range in affect, no acute mental health symptoms  observed or noted in her her thought process.  She does present with high expectations for herself and rigid thought processes as she strives to have everything be "perfect", but she also acknowledges that these expectations may not be realistic.  She was observed to be interacting and responding to the infant during the entire visit.  Miranda Oliver acknowledged role of CSW and reason for visit, and she was agreeable to the visit.    Miranda Oliver shared excitement secondary to arrival of the infant, but stated that she is also concerned about how her 32 year old will transition to being a big brother since he is accustomed to being an "only child".  Miranda Oliver acknowledged that it will be an adjustment process, but expressed confidence that she will be able to continue to provide him with love and attention that he needs.  The Miranda Oliver confirmed that the home is prepared for the infant and that she feels well supported.   Miranda Oliver denied current mental health concerns, but acknowledged history.  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  She discussed that they were "early" miscarriages, but indicated that she had started to become attached and create a vision for their lives together when she experienced the loses.  She discussed that these were difficult experiences with her.  CSW provided supportive listening and validated her feelings.  The Miranda Oliver acknowledged prescription medications to assist with her symptoms, but discussed that she discontinued the medications when she and the  FOB decided to try to get pregnant again.  She admitted that she did have significant anxiety during the pregnancy out of "fear" that something bad would happen to the infant. She discussed a sense of relief since the infant has arrived, but she also presented with rigid thought processes and high standards for herself as she refected desire for "everything to be right".  She does present with awareness that it is  impossible for everything to be "perfect", and shared an awareness to be "flexible".  In regards to breastfeeding, she stated that she is attempting to be patient since it is a new learning experience for the infant.  She discussed some difficulties with feeding, but shared awareness that it is not her or the infant's "fault" if there are difficulties with feedings.  She shared that she is remaining focused on the infant receiving sufficient calories for their health. Despite her original report of wanting everything to be "perfect", she does present with insight on need to be flexible and patient as she enters the postpartum period.   Miranda Oliver denied history of postpartum depression, but acknowledged increased risk due to history of depression, anxiety, and symptoms of anxiety during the pregnancy.  Miranda Oliver was receptive to exploring daily self-care activities that may assist her to feel less overwhelmed.  Miranda Oliver acknowledged importance of sleep and "me time".   Miranda Oliver denied additional questions, concerns, or needs at this time. She expressed appreciation for the visit, and acknowledged ongoing CSW availability if needed during admission.    VI SOCIAL WORK PLAN Social Work Therapist, art  No Further Intervention Required / No Barriers to Discharge   Type of pt/family education:   Postpartum depression   If child protective services report - county:  N/A If child protective services report - date:  N/A Information/referral to community resources comment:   No referrals needed.  Miranda Oliver reported intention to notify her PCP or her OB if she notes increase in depression, anxiety, or onset of PPD.   Other social work plan:   CSW to follow up as needed or upon Miranda Oliver request.

## 2015-01-18 NOTE — Lactation Note (Signed)
This note was copied from the chart of Miranda Minus LibertyKimberly Eckenrode. Lactation Consultation Note; Infant is 3223 hours old. Mother has had 4 attempts to breastfeed in the last 14  Hours. Infant fed good for 2 feedings at breast per mother. Mother expresses desire to breastfeed this infant. She states she was unable to breastfeed her first child successfully. Mother has a hand pump at the bedside. She has attempt to pump 2 times. Assist mother with hand expression. Observed at few drops of colostrum. Mother is active with WIC. Mother gave 27 ml of Alimentum at 9;15. Mother was sat up with a DEBP and advised to pump every 2-3 hours for 15-20 mins. Observed that mothers Rt nipple is erect and Lt nipple is semi flat. Discussed use of a nipple shield. Mother is to page [facility].   Patient Name: Miranda Oliver ZOXWR'UToday's Date: 01/18/2015 Reason for consult: Follow-up assessment   Maternal Data    Feeding Feeding Type: Bottle Fed - Formula  LATCH Score/Interventions                      Lactation Tools Discussed/Used     Consult Status Consult Status: Follow-up Date: 01/18/15 Follow-up type: In-patient    Stevan BornKendrick, Miranda Oliver 01/18/2015, 11:43 AM

## 2015-01-18 NOTE — Anesthesia Postprocedure Evaluation (Signed)
  Anesthesia Post-op Note  Patient: Miranda Oliver  Procedure(s) Performed: * No procedures listed *  Patient Location: Mother/Baby  Anesthesia Type:Epidural  Level of Consciousness: awake, alert , oriented and patient cooperative  Airway and Oxygen Therapy: Patient Spontanous Breathing  Post-op Pain: none  Post-op Assessment: Post-op Vital signs reviewed, Patient's Cardiovascular Status Stable, Respiratory Function Stable, Patent Airway, No headache, No backache, No residual numbness and No residual motor weakness  Post-op Vital Signs: Reviewed and stable  Last Vitals:  Filed Vitals:   01/18/15 0629  BP: 145/88  Pulse: 73  Temp: 36.7 C  Resp: 18    Complications: No apparent anesthesia complications

## 2015-01-18 NOTE — Discharge Instructions (Signed)

## 2015-01-18 NOTE — Discharge Summary (Signed)
Obstetric Discharge Summary Reason for Admission: Induction gHTN Prenatal Procedures: none Intrapartum Procedures: spontaneous vaginal delivery Postpartum Procedures: none Complications-Operative and Postpartum: none  Delivery Note At 11:29 AM a viable female was delivered via Vaginal, Spontaneous Delivery (Presentation: Right Occiput Anterior).  APGAR: 9, 9; weight 7 lb 0.2 oz (3180 g).   Placenta status: Intact, Spontaneous.  Cord: 3 vessels with the following complications: None.  Cord pH: n/a  Anesthesia: Epidural  Episiotomy: None Lacerations: None Suture Repair: n/a Est. Blood Loss (mL): 300  Mom to postpartum.  Baby to Couplet care / Skin to Skin.  Myra RudeSchmitz, Jeremy E 01/18/2015, 7:44 AM     Hospital Course:  Active Problems:   Pregnancy   Miranda Oliver is a 34 y.o. [id number] s/p SVD.   She has postpartum course that was uncomplicated including no problems with ambulating, PO intake, urination, pain, or bleeding. The pt feels ready to go home and  will be discharged with outpatient follow-up.   Today: No acute events overnight.  Pt denies problems with ambulating, voiding or po intake.  She denies nausea or vomiting.  Pain is well controlled.  She has had flatus. She has not had bowel movement.  Lochia Minimal.  Plan for birth control is  BTL .  Method of Feeding: breast   H/H: Lab Results  Component Value Date/Time   HGB 10.3* 01/18/2015 05:45 AM   HCT 30.7* 01/18/2015 05:45 AM    Discharge Diagnoses: Term Pregnancy-delivered  Discharge Information: Date: 01/18/2015 Activity: pelvic rest Diet: routine  Medications: Ibuprofen Breast feeding:  Yes Condition: stable Instructions: refer to handout Discharge to: home  Follow-up Information    Follow up with [facility]. Schedule an appointment as soon as possible for a visit in 2 weeks.   Why:  For your postpartum appointment and to plan to get your tubes tied.   Contact information:   [address]  [address] [place] [phone]        Medication List    TAKE these medications        acetaminophen 500 MG tablet  Commonly known as:  TYLENOL  Take 1,000 mg by mouth every 6 (six) hours as needed for mild pain.     calcium carbonate 750 MG chewable tablet  Commonly known as:  TUMS EX  Chew 2 tablets by mouth daily as needed for heartburn.     prenatal multivitamin Tabs tablet  Take 1 tablet by mouth 2 (two) times daily.         Myra RudeSchmitz, Jeremy E ,MD PGY-2 01/18/2015,7:44 AM   I have seen and examined this patient and I agree with the above. Cam HaiSHAW, Alante CNM 10:09 AM 01/18/2015

## 2015-01-18 NOTE — Lactation Note (Signed)
This note was copied from the chart of Miranda Minus LibertyKimberly Fetters. Lactation Consultation Note; Mother assist with latching infant on the Lt breast. Lt nipple is flat and inverts with compression. Attempt to use a #24 nipple shield. The nipple shield was difficult to stay on the Lt nipple. Infant latched on the alternate breast with a #24 nipple shield. Infant latched on with good burst of suckling. Infant was given 20 ml of alimentum via the nipple shield and curved tip syringe. Mother was taught proper application of the nipple shield. Advised mother to continue to post pump for 15-20 mins after each feeding. Mother was receptive to all teaching . Mother to request assistance from staff as often as needed with latch.   Patient Name: Miranda Oliver XBMWU'XToday's Date: 01/18/2015 Reason for consult: Follow-up assessment   Maternal Data    Feeding Feeding Type: Breast Fed Length of feed: 0 min (Mom couldn't get baby to latch)  LATCH Score/Interventions Latch: Repeated attempts needed to sustain latch, nipple held in mouth throughout feeding, stimulation needed to elicit sucking reflex. Intervention(s): Skin to skin;Teach feeding cues;Waking techniques Intervention(s): Adjust position;Assist with latch;Breast compression  Audible Swallowing: Spontaneous and intermittent Intervention(s): Hand expression Intervention(s): Alternate breast massage  Type of Nipple: Everted at rest and after stimulation (Rt nipple only , Lt flat) Intervention(s): Double electric pump  Comfort (Breast/Nipple): Soft / non-tender     Hold (Positioning): Assistance needed to correctly position infant at breast and maintain latch. Intervention(s): Support Pillows;Position options  LATCH Score: 8  Lactation Tools Discussed/Used Tools: Nipple Shields Nipple shield size: 24   Consult Status Consult Status: Follow-up Date: 01/18/15 Follow-up type: In-patient    Stevan BornKendrick, Shakema Surita [facility] 01/18/2015, 4:30 PM

## 2015-01-19 ENCOUNTER — Ambulatory Visit: Payer: Self-pay

## 2015-01-19 NOTE — Lactation Note (Signed)
This note was copied from the chart of Miranda Minus LibertyKimberly Behney. Lactation Consultation Note: Mother states that breastfeeding has been much better. She was able to latch infant on the Rt  breast without the nipple shield. She states that she still has difficulty latching on the Left breast. Discussed importance of protecting milk supply and pumping breast every 2-3 hours until milk comes to volume. [facility]. She declined stating that she has an appointment on Tuesday with [facility]IC. She may use the hand pump or she may purchase a pump today. Reviewed treatment to prevent severe engorgement. Advised mother to breast feed on cue and feed her infant at least 8-12 times in 24 hours. Discussed cluster feeding and cue base feeding . Mother was offered an outpatient visit with lactation services. She declines at this time and will phone office if needed for follow up. She is aware of community support.   Patient Name: Miranda Minus LibertyKimberly Harned ZOXWR'UToday's Date: 01/19/2015 Reason for consult: Follow-up assessment   Maternal Data    Feeding Feeding Type: Bottle Fed - Formula Length of feed: 22 min  LATCH Score/Interventions                      Lactation Tools Discussed/Used     Consult Status Consult Status: Complete    Michel BickersKendrick, Shunda Rabadi McCoy 01/19/2015, 11:30 AM

## 2015-01-25 ENCOUNTER — Ambulatory Visit (INDEPENDENT_AMBULATORY_CARE_PROVIDER_SITE_OTHER): Payer: BLUE CROSS/BLUE SHIELD | Admitting: Advanced Practice Midwife

## 2015-01-25 ENCOUNTER — Encounter: Payer: Self-pay | Admitting: Advanced Practice Midwife

## 2015-01-25 VITALS — BP 162/92 | Wt 239.0 lb

## 2015-01-25 DIAGNOSIS — O139 Gestational [pregnancy-induced] hypertension without significant proteinuria, unspecified trimester: Secondary | ICD-10-CM

## 2015-01-25 DIAGNOSIS — O135 Gestational [pregnancy-induced] hypertension without significant proteinuria, complicating the puerperium: Secondary | ICD-10-CM

## 2015-01-25 DIAGNOSIS — Z331 Pregnant state, incidental: Secondary | ICD-10-CM

## 2015-01-25 DIAGNOSIS — Z1389 Encounter for screening for other disorder: Secondary | ICD-10-CM | POA: Diagnosis not present

## 2015-01-25 MED ORDER — TRIAMTERENE-HCTZ 37.5-25 MG PO TABS: 1.0000 | ORAL_TABLET | Freq: Every day | ORAL | Status: DC

## 2015-01-25 NOTE — Progress Notes (Signed)
Pt here today to follow up on elevated BP. Pt states that she has had swelling in her feet and hands.

## 2015-01-25 NOTE — Progress Notes (Signed)
[facility] Visit  Patient name: Miranda Oliver MRN [id number]  Date of birth: 11-25-1981  CC & HPI:  Miranda Oliver is a 34 y.o. African American female presenting today for blood pressure check.  She had a SVD following IOL for GHTN 1 week ago.  She is breastfeeding.  Pertinent History Reviewed:  Medical & Surgical Hx:   Past Medical History  Diagnosis Date  . Anxiety   . GERD (gastroesophageal reflux disease)   . Depression    Past Surgical History  Procedure Laterality Date  . Dilation and curettage of uterus  2013 & 2014     MAB x2    Family History  Problem Relation Age of Onset  . Stroke Mother   . Hypertension Mother   . Diabetes Mother   . Hypertension Sister   . Hypertension Father     Current outpatient prescriptions:  .  acetaminophen (TYLENOL) 500 MG tablet, Take 1,000 mg by mouth every 6 (six) hours as needed for mild pain. , Disp: , Rfl:  .  Prenatal Vit-Fe Fumarate-FA (PRENATAL MULTIVITAMIN) TABS tablet, Take 1 tablet by mouth 2 (two) times daily. , Disp: , Rfl:  .  triamterene-hydrochlorothiazide (MAXZIDE-25) 37.5-25 MG per tablet, Take 1 tablet by mouth daily., Disp: 30 tablet, Rfl: 2 Social History: Reviewed -  reports that she has never smoked. She has never used smokeless tobacco.  Review of Systems:   Constitutional: Negative for Headache, blurred vision Gastrointestinal: Negative for abdominal pain, no RUQ pain Genitourinary: Negative for dysuria and urgency, vaginal irritation or itching   Objective Findings:  Vitals: BP 162/92 mmHg  Wt 239 lb (108.41 kg)  LMP 03/29/2014  Physical Examination: General appearance - alert, well appearing, and in no distress Mental status - alert, oriented to person, place, and time Chest - clear to auscultation, no wheezes, rales or rhonchi, symmetric air entry Heart - normal rate and regular rhythm Neurological - alert, oriented, normal speech, no focal findings or movement disorder noted, DTR's  normal and symmetric Musculoskeletal - no muscular tenderness noted Extremities - no pedal edema noted  No results found for this or any previous visit (from the past 24 hour(s)).        Assessment & Plan:  A:   Persistant gestational hypertension vs chronic hypertension P:  CBC, CMP (urine dipped negative protein )  Start Maxide 37.5/25 qd   F/U 1 week for BP check.  Has home monitor, will probably allow home monitoring once correct dosage of medicine is established   CRESENZO-DISHMAN,Naveya Ellerman CNM 01/25/2015 11:47 AM

## 2015-01-26 LAB — CBC
HCT: 35.5 % (ref 34.0–46.6)
Hemoglobin: 11.8 g/dL (ref 11.1–15.9)
MCH: 28.9 pg (ref 26.6–33.0)
MCHC: 33.2 g/dL (ref 31.5–35.7)
MCV: 87 fL (ref 79–97)
Platelets: 287 10*3/uL (ref 150–379)
RBC: 4.09 x10E6/uL (ref 3.77–5.28)
RDW: 14.8 % (ref 12.3–15.4)
WBC: 3.5 10*3/uL (ref 3.4–10.8)

## 2015-01-26 LAB — COMPREHENSIVE METABOLIC PANEL
ALT: 39 IU/L — ABNORMAL HIGH (ref 0–32)
AST: 32 IU/L (ref 0–40)
Albumin/Globulin Ratio: 1.6 (ref 1.1–2.5)
Albumin: 4 g/dL (ref 3.5–5.5)
Alkaline Phosphatase: 86 IU/L (ref 39–117)
BUN/Creatinine Ratio: 11 (ref 8–20)
BUN: 10 mg/dL (ref 6–20)
Bilirubin Total: 0.2 mg/dL (ref 0.0–1.2)
CO2: 25 mmol/L (ref 18–29)
Calcium: 9.8 mg/dL (ref 8.7–10.2)
Chloride: 101 mmol/L (ref 97–108)
Creatinine, Ser: 0.87 mg/dL (ref 0.57–1.00)
GFR calc Af Amer: 103 mL/min/{1.73_m2} (ref >59)
GFR calc non Af Amer: 89 mL/min/{1.73_m2} (ref >59)
Globulin, Total: 2.5 g/dL (ref 1.5–4.5)
Glucose: 85 mg/dL (ref 65–99)
Potassium: 4.1 mmol/L (ref 3.5–5.2)
Sodium: 143 mmol/L (ref 134–144)
Total Protein: 6.5 g/dL (ref 6.0–8.5)

## 2015-01-31 LAB — POCT URINALYSIS DIPSTICK
Blood, UA: NEGATIVE
Glucose, UA: NEGATIVE
Ketones, UA: NEGATIVE
Leukocytes, UA: NEGATIVE
Nitrite, UA: NEGATIVE
Protein, UA: NEGATIVE

## 2015-02-01 ENCOUNTER — Ambulatory Visit (INDEPENDENT_AMBULATORY_CARE_PROVIDER_SITE_OTHER): Payer: BLUE CROSS/BLUE SHIELD | Admitting: Advanced Practice Midwife

## 2015-02-01 ENCOUNTER — Encounter: Payer: Self-pay | Admitting: Advanced Practice Midwife

## 2015-02-01 VITALS — BP 130/90 | HR 84 | Ht 62.0 in | Wt 227.0 lb

## 2015-02-01 DIAGNOSIS — O135 Gestational [pregnancy-induced] hypertension without significant proteinuria, complicating the puerperium: Secondary | ICD-10-CM

## 2015-02-01 DIAGNOSIS — O139 Gestational [pregnancy-induced] hypertension without significant proteinuria, unspecified trimester: Secondary | ICD-10-CM

## 2015-02-01 NOTE — Progress Notes (Signed)
[facility] Visit  Patient name: Miranda Oliver  Date of birth: [birth year]  CC & HPI:  Miranda Oliver is a 34 y.o. African American female presenting today for Blood pressure check.  She has postpartum hypertension and was placed on Maxide 37.5 1 week ago.  Denies HA, blurred vision, or RUQ pain    Current outpatient prescriptions:  .  acetaminophen (TYLENOL) 500 MG tablet, Take 1,000 mg by mouth every 6 (six) hours as needed for mild pain. , Disp: , Rfl:  .  Prenatal Vit-Fe Fumarate-FA (PRENATAL MULTIVITAMIN) TABS tablet, Take 1 tablet by mouth 2 (two) times daily. , Disp: , Rfl:  .  triamterene-hydrochlorothiazide (MAXZIDE-25) 37.5-25 MG per tablet, Take 1 tablet by mouth daily., Disp: 30 tablet, Rfl: 2   Objective Findings:  Vitals: BP 130/90 mmHg  Pulse 84  Ht 5\' 2"  (1.575 m)  Wt 227 lb (102.967 kg)  BMI 41.51 kg/m2  Breastfeeding? Yes  Physical Examination: General appearance - alert, well appearing, and in no distress   Also discussed BTL vs Salpingectomy.  Chooses salpingectomy  Assessment & Plan:  A:   Postpartum hypertension, controlled on BP med P:  Continue current med; stop taking 3 days before next appt.   F/U 3 weeks for postpartum exam, pre-op for salpingectomy. Papers signed today.   CRESENZO-DISHMAN,Jordy Verba CNM 02/01/2015 12:44 PM

## 2015-02-01 NOTE — Patient Instructions (Addendum)
Stop blood pressure medicine 3 days before next appointment

## 2015-02-22 ENCOUNTER — Encounter: Payer: Self-pay | Admitting: Obstetrics & Gynecology

## 2015-02-22 ENCOUNTER — Ambulatory Visit (INDEPENDENT_AMBULATORY_CARE_PROVIDER_SITE_OTHER): Payer: BLUE CROSS/BLUE SHIELD | Admitting: Obstetrics & Gynecology

## 2015-02-22 NOTE — Progress Notes (Signed)
Patient ID: Miranda Oliver, female   DOB: 03-21-1981, 34 y.o.   MRN: [id number] Subjective:     Miranda Oliver is a 34 y.o. female who presents for a postpartum visit. She is 5 weeks postpartum following a spontaneous vaginal delivery. I have fully reviewed the prenatal and intrapartum course. The delivery was at 38 gestational weeks. Outcome: spontaneous vaginal delivery. Anesthesia: epidural. Postpartum course has been elevated BP issues. Baby's course has been unremarkable. Baby is feeding by bottle - Similac with Iron. Bleeding no bleeding. Bowel function is normal. Bladder function is normal. Patient is sexually active. Contraception method is condoms. Postpartum depression screening: negative.  The following portions of the patient's history were reviewed and updated as appropriate: allergies, current medications, past family history, past medical history, past social history, past surgical history and problem list.  Review of Systems Pertinent items are noted in HPI.   Objective:    BP 128/70 mmHg  Pulse 80  Wt 233 lb (105.688 kg)  General:  alert, cooperative and no distress   Breasts:    Lungs:   Heart:    Abdomen:    Vulva:  normal  Vagina: normal vagina, no discharge, exudate, lesion, or erythema  Cervix:  no cervical motion tenderness and no lesions  Corpus: normal size, contour, position, consistency, mobility, non-tender  Adnexa:  normal adnexa and no mass, fullness, tenderness  Rectal Exam:         Assessment:     Normal postpartum exam. Pap smear not done at today's visit.   Plan:    1. Contraception: condoms and tubal ligation 2. Pre op for BTL 1 weeks 3. Follow up in: 1 week pre op or as needed.

## 2015-03-02 ENCOUNTER — Encounter: Payer: Self-pay | Admitting: Obstetrics & Gynecology

## 2015-03-02 ENCOUNTER — Ambulatory Visit (INDEPENDENT_AMBULATORY_CARE_PROVIDER_SITE_OTHER): Payer: BLUE CROSS/BLUE SHIELD | Admitting: Obstetrics & Gynecology

## 2015-03-02 VITALS — BP 116/70 | HR 76 | Wt 234.0 lb

## 2015-03-02 DIAGNOSIS — Z302 Encounter for sterilization: Secondary | ICD-10-CM | POA: Diagnosis not present

## 2015-03-02 NOTE — Progress Notes (Signed)
Patient ID: Miranda Oliver, female   DOB: 26-Feb-1981, 34 y.o.   MRN: [id number] Preoperative History and Physical  Miranda KaysKimberly N Oliver is a 34 y.o. [phone] [id number] with No LMP recorded. admitted for a laparoscopic bilateral salpingectomy for sterilization.  Desires salpingectomy  PMH:    Past Medical History  Diagnosis Date  . Anxiety   . GERD (gastroesophageal reflux disease)   . Depression     PSH:     Past Surgical History  Procedure Laterality Date  . Dilation and curettage of uterus  2013 & 2014     MAB x2     POb/GynH:      OB History    Gravida Para Term Preterm AB TAB SAB Ectopic Multiple Living   4 2 2  0 2 0 2 0 0 2      SH:   History  Substance Use Topics  . Smoking status: Never Smoker   . Smokeless tobacco: Never Used  . Alcohol Use: No     Comment: occassional     FH:    Family History  Problem Relation Age of Onset  . Stroke Mother   . Hypertension Mother   . Diabetes Mother   . Hypertension Sister   . Hypertension Father      Allergies: No Known Allergies  Medications:       Current outpatient prescriptions:  .  acetaminophen (TYLENOL) 500 MG tablet, Take 1,000 mg by mouth every 6 (six) hours as needed for mild pain. , Disp: , Rfl:  .  Prenatal Vit-Fe Fumarate-FA (PRENATAL MULTIVITAMIN) TABS tablet, Take 1 tablet by mouth 2 (two) times daily. , Disp: , Rfl:  .  triamterene-hydrochlorothiazide (MAXZIDE-25) 37.5-25 MG per tablet, Take 1 tablet by mouth daily. (Patient not taking: Reported on 02/22/2015), Disp: 30 tablet, Rfl: 2  Review of Systems:   Review of Systems  Constitutional: Negative for fever, chills, weight loss, malaise/fatigue and diaphoresis.  HENT: Negative for hearing loss, ear pain, nosebleeds, congestion, sore throat, neck pain, tinnitus and ear discharge.   Eyes: Negative for blurred vision, double vision, photophobia, pain, discharge and redness.  Respiratory: Negative for cough, hemoptysis, sputum production, shortness of breath,  wheezing and stridor.   Cardiovascular: Negative for chest pain, palpitations, orthopnea, claudication, leg swelling and PND.  Gastrointestinal: Positive for abdominal pain. Negative for heartburn, nausea, vomiting, diarrhea, constipation, blood in stool and melena.  Genitourinary: Negative for dysuria, urgency, frequency, hematuria and flank pain.  Musculoskeletal: Negative for myalgias, back pain, joint pain and falls.  Skin: Negative for itching and rash.  Neurological: Negative for dizziness, tingling, tremors, sensory change, speech change, focal weakness, seizures, loss of consciousness, weakness and headaches.  Endo/Heme/Allergies: Negative for environmental allergies and polydipsia. Does not bruise/bleed easily.  Psychiatric/Behavioral: Negative for depression, suicidal ideas, hallucinations, memory loss and substance abuse. The patient is not nervous/anxious and does not have insomnia.      PHYSICAL EXAM:  Blood pressure 116/70, pulse 76, weight 234 lb (106.142 kg), currently breastfeeding.    Vitals reviewed. Constitutional: She is oriented to person, place, and time. She appears well-developed and well-nourished.  HENT:  Head: Normocephalic and atraumatic.  Right Ear: External ear normal.  Left Ear: External ear normal.  Nose: Nose normal.  Mouth/Throat: Oropharynx is clear and moist.  Eyes: Conjunctivae and EOM are normal. Pupils are equal, round, and reactive to light. Right eye exhibits no discharge. Left eye exhibits no discharge. No scleral icterus.  Neck: Normal range of motion. Neck supple. No  tracheal deviation present. No thyromegaly present.  Cardiovascular: Normal rate, regular rhythm, normal heart sounds and intact distal pulses.  Exam reveals no gallop and no friction rub.   No murmur heard. Respiratory: Effort normal and breath sounds normal. No respiratory distress. She has no wheezes. She has no rales. She exhibits no tenderness.  GI: Soft. Bowel sounds are  normal. She exhibits no distension and no mass. There is tenderness. There is no rebound and no guarding.  Genitourinary:       Vulva is normal without lesions Vagina is pink moist without discharge Cervix normal in appearance and pap is normal Uterus is normal Adnexa is negative with normal sized ovaries by sonogram  Musculoskeletal: Normal range of motion. She exhibits no edema and no tenderness.  Neurological: She is alert and oriented to person, place, and time. She has normal reflexes. She displays normal reflexes. No cranial nerve deficit. She exhibits normal muscle tone. Coordination normal.  Skin: Skin is warm and dry. No rash noted. No erythema. No pallor.  Psychiatric: She has a normal mood and affect. Her behavior is normal. Judgment and thought content normal.    Labs: No results found for this or any previous visit (from the past 336 hour(s)).  EKG: No orders found for this or any previous visit.  Imaging Studies: No results found.    Assessment: Desires permanent sterilization Patient Active Problem List   Diagnosis Date Noted  . Pregnancy 01/16/2015  . Chronic hypertension 01/03/2015  . Gestational hypertension   . Encounter for fetal anatomic survey   . [redacted] weeks gestation of pregnancy   . Obesity 06/27/2014  . General counseling and advice for procreative management 02/15/2014    Plan: Laparoscopic bilateral salpingectomy for sterilization  EURE,LUTHER H 03/02/2015 10:15 AM

## 2015-03-05 NOTE — Patient Instructions (Signed)
Miranda Oliver  03/05/2015   Your procedure is scheduled on:  03/07/2015  Report to [facility] at  930  AM.  Call this number if you have problems the morning of surgery: [phone]   Remember:   Do not eat food or drink liquids after midnight.   Take these medicines the morning of surgery with A SIP OF WATER:  none   Do not wear jewelry, make-up or nail polish.  Do not wear lotions, powders, or perfumes.   Do not shave 48 hours prior to surgery. Men may shave face and neck.  Do not bring valuables to the hospital.  [facility] is not responsible for any belongings or valuables.               Contacts, dentures or bridgework may not be worn into surgery.  Leave suitcase in the car. After surgery it may be brought to your room.  For patients admitted to the hospital, discharge time is determined by your  treatment team.               Patients discharged the day of surgery will not be allowed to drive home.  Name and phone number of your driver: family  Special Instructions: Shower using CHG 2 nights before surgery and the night before surgery.  If you shower the day of surgery use CHG.  Use special wash - you have one bottle of CHG for all showers.  You should use approximately 1/3 of the bottle for each shower.   Please read over the following fact sheets that you were given: Pain Booklet, Coughing and Deep Breathing, Surgical Site Infection Prevention, Anesthesia Post-op Instructions and Care and Recovery After Surgery Sterilization Information, Female Female sterilization is a procedure to permanently prevent pregnancy. There are different ways to perform sterilization, but all either block or close the fallopian tubes so that your eggs cannot reach your uterus. If your egg cannot reach your uterus, sperm cannot fertilize the egg, and you cannot get pregnant.  Sterilization is performed by a surgical procedure. Sometimes these procedures are performed in a hospital while a  patient is asleep. Sometimes they can be done in a clinic setting with the patient awake. The fallopian tubes can be surgically cut, tied, or sealed through a procedure called tubal ligation. The fallopian tubes can also be closed with clips or rings. Sterilization can also be done by placing a tiny coil into each fallopian tube, which causes scar tissue to grow inside the tube. The scar tissue then blocks the tubes.  Discuss sterilization with your caregiver to answer any concerns you or your partner may have. You may want to ask what type of sterilization your caregiver performs. Some caregivers may not perform all the various options. Sterilization is permanent and should only be done if you are sure you do not want children or do not want any more children. Having a sterilization reversed may not be successful.  STERILIZATION PROCEDURES  Laparoscopic sterilization. This is a surgical method performed at a time other than right after childbirth. Two incisions are made in the lower abdomen. A thin, lighted tube (laparoscope) is inserted into one of the incisions and is used to perform the procedure. The fallopian tubes are closed with a ring or a clip. An instrument that uses heat could be used to seal the tubes closed (electrocautery).   Mini-laparotomy. This is a surgical method done 1 or 2 days after  giving birth. Typically, a small incision is made just below the belly button (umbilicus) and the fallopian tubes are exposed. The tubes can then be sealed, tied, or cut.   Hysteroscopic sterilization. This is performed at a time other than right after childbirth. A tiny, spring-like coil is inserted through the cervix and uterus and placed into the fallopian tubes. The coil causes scaring and blocks the tubes. Other forms of contraception should be used for 3 months after the procedure to allow the scar tissue to form completely. Additionally, it is required hysterosalpingography be done 3 months later  to ensure that the procedure was successful. Hysterosalpingography is a procedure that uses X-rays to look at your uterus and fallopian tubes after a material to make them show up better has been inserted. IS STERILIZATION SAFE? Sterilization is considered safe with very rare complications. Risks depend on the type of procedure you have. As with any surgical procedure, there are risks. Some risks of sterilization by any means include:   Bleeding.  Infection.  Reaction to anesthesia medicine.  Injury to surrounding organs. Risks specific to having hysteroscopic coils placed include:  The coils may not be placed correctly the first time.   The coils may move out of place.   The tubes may not get completely blocked after 3 months.   Injury to surrounding organs when placing the coil.  HOW EFFECTIVE IS FEMALE STERILIZATION? Sterilization is nearly 100% effective, but it can fail. Depending on the type of sterilization, the rate of failure can be as high as 3%. After hysteroscopic sterilization with placement of fallopian tube coils, you will need back-up birth control for 3 months after the procedure. Sterilization is effective for a lifetime.  BENEFITS OF STERILIZATION  It does not affect your hormones, and therefore will not affect your menstrual periods, sexual desire, or performance.   It is effective for a lifetime.   It is safe.   You do not need to worry about getting pregnant. Keep in mind that if you had the hysteroscopic placement procedure, you must wait 3 months after the procedure (or until your caregiver confirms) before pregnancy is not considered possible.   There are no side effects unlike other types of birth control (contraception).  DRAWBACKS OF STERILIZATION  You must be sure you do not want children or any more children. The procedure is permanent.   It does not provide protection against sexually transmitted infections (STIs).   The tubes can  grow back together. If this happens, there is a risk of pregnancy. There is also an increased risk (50%) of pregnancy being an ectopic pregnancy. This is a pregnancy that happens outside of the uterus. Document Released: 05/05/2008 Document Revised: 11/22/2013 Document Reviewed: 03/04/2012 [facility] Patient Information 2015 [place], [place]. This information is not intended to replace advice given to you by your health care provider. Make sure you discuss any questions you have with your health care provider. Salpingectomy Salpingectomy, also called tubectomy, is the surgical removal of one of the fallopian tubes. The fallopian tubes are tubes that are connected to the uterus. These tubes transport the egg from the ovary to the uterus. A salpingectomy may be done for various reasons, including:   A tubal (ectopic) pregnancy. This is especially true if the tube ruptures.  An infected fallopian tube.  The need to remove the fallopian tube when removing an ovary with a cyst or tumor.  The need to remove the fallopian tube when removing the uterus.  Cancer  of the fallopian tube or nearby organs. Removing one fallopian tube does not prevent you from becoming pregnant. It also does not cause problems with your menstrual periods.  LET [facility]OUR [facility]ALTH CARE PROVIDER KNOW ABOUT:  Any allergies you have.  All medicines you are taking, including vitamins, herbs, eye drops, creams, and over-the-counter medicines.  Previous problems you or members of your family have had with the use of anesthetics.  Any blood disorders you have.  Previous surgeries you have had.  Medical conditions you have. RISKS AND COMPLICATIONS  Generally, this is a safe procedure. However, as with any procedure, complications can occur. Possible complications include:  Injury to surrounding organs.  Bleeding.  Infection.  Problems related to anesthesia. BEFORE THE PROCEDURE  Ask your health care provider about changing or  stopping your regular medicines. You may need to stop taking certain medicines, such as aspirin or blood thinners, at least 1 week before the surgery.  Do not eat or drink anything for at least 8 hours before the surgery.  If you smoke, do not smoke for at least 2 weeks before the surgery.  Make plans to have someone drive you home after the procedure or after your hospital stay. Also arrange for someone to help you with activities during recovery. PROCEDURE   You will be given medicine to help you relax before the procedure (sedative). You will then be given medicine to make you sleep through the procedure (general anesthetic). These medicines will be given through an IV access tube that is put into one of your veins.  Once you are asleep, your lower abdomen will be shaved and cleaned. A thin, flexible tube (catheter) will be placed in your bladder.  The surgeon may use a laparoscopic, robotic, or open technique for this surgery:  In the laparoscopic technique, the surgery is done through two small cuts (incisions) in the abdomen. A thin, lighted tube with a tiny camera on the end (laparoscope) is inserted into one of the incisions. The tools needed for the procedure are put through the other incision.  A robotic technique may be chosen to perform complex surgery in a small space. In the robotic technique, small incisions will be made. A camera and surgical instruments are passed through the incisions. Surgical instruments will be controlled with the help of a robotic arm.  In the open technique, the surgery is done through one large incision in the abdomen.  Using any of these techniques, the surgeon removes the fallopian tube from where it attaches to the uterus. The blood vessels will be clamped and tied.  The surgeon then uses staples or stitches to close the incision or incisions. AFTER THE PROCEDURE   You will be taken to a recovery area where your progress will be monitored for 1-3  hours.  If the laparoscopic technique was used, you may be allowed to go home after several hours. You may have some shoulder pain after the laparoscopic procedure. This is normal and usually goes away in a day or two.  If the open technique was used, you will be admitted to the hospital for a couple of days.  You will be given pain medicine if needed.  The IV access tube and catheter will be removed before you are discharged. Document Released: 04/05/2009 Document Revised: 09/07/2013 Document Reviewed: 05/11/2013 [facility] Patient Information 2015 [place]. This information is not intended to replace advice given to you by your health care provider. Make sure you discuss any questions you  have with your health care provider. PATIENT INSTRUCTIONS POST-ANESTHESIA  IMMEDIATELY FOLLOWING SURGERY:  Do not drive or operate machinery for the first twenty four hours after surgery.  Do not make any important decisions for twenty four hours after surgery or while taking narcotic pain medications or sedatives.  If you develop intractable nausea and vomiting or a severe headache please notify your doctor immediately.  FOLLOW-UP:  Please make an appointment with your surgeon as instructed. You do not need to follow up with anesthesia unless specifically instructed to do so.  WOUND CARE INSTRUCTIONS (if applicable):  Keep a dry clean dressing on the anesthesia/puncture wound site if there is drainage.  Once the wound has quit draining you may leave it open to air.  Generally you should leave the bandage intact for twenty four hours unless there is drainage.  If the epidural site drains for more than 36-48 hours please call the anesthesia department.  QUESTIONS?:  Please feel free to call your physician or the hospital operator if you have any questions, and they will be happy to assist you.

## 2015-03-06 ENCOUNTER — Encounter (HOSPITAL_COMMUNITY): Payer: Self-pay

## 2015-03-06 ENCOUNTER — Encounter (HOSPITAL_COMMUNITY)
Admission: RE | Admit: 2015-03-06 | Discharge: 2015-03-06 | Disposition: A | Discharge status: Home / Self Care | Payer: BLUE CROSS/BLUE SHIELD | Source: Ambulatory Visit | Attending: Obstetrics & Gynecology | Admitting: Obstetrics & Gynecology

## 2015-03-06 DIAGNOSIS — F329 Major depressive disorder, single episode, unspecified: Secondary | ICD-10-CM | POA: Diagnosis not present

## 2015-03-06 DIAGNOSIS — I10 Essential (primary) hypertension: Secondary | ICD-10-CM | POA: Diagnosis not present

## 2015-03-06 DIAGNOSIS — F419 Anxiety disorder, unspecified: Secondary | ICD-10-CM | POA: Diagnosis not present

## 2015-03-06 DIAGNOSIS — K219 Gastro-esophageal reflux disease without esophagitis: Secondary | ICD-10-CM | POA: Diagnosis not present

## 2015-03-06 DIAGNOSIS — Z4002 Encounter for prophylactic removal of ovary: Secondary | ICD-10-CM | POA: Diagnosis not present

## 2015-03-06 DIAGNOSIS — Z641 Problems related to multiparity: Secondary | ICD-10-CM | POA: Diagnosis not present

## 2015-03-06 DIAGNOSIS — Z302 Encounter for sterilization: Secondary | ICD-10-CM | POA: Diagnosis not present

## 2015-03-06 LAB — URINALYSIS, ROUTINE W REFLEX MICROSCOPIC
Bilirubin Urine: NEGATIVE
Glucose, UA: NEGATIVE mg/dL
Hgb urine dipstick: NEGATIVE
Ketones, ur: NEGATIVE mg/dL
Leukocytes, UA: NEGATIVE
Nitrite: NEGATIVE
Protein, ur: NEGATIVE mg/dL
Specific Gravity, Urine: 1.02 (ref 1.005–1.030)
Urobilinogen, UA: 0.2 mg/dL (ref 0.0–1.0)
pH: 6 (ref 5.0–8.0)

## 2015-03-06 LAB — CBC
HCT: 34.4 % — ABNORMAL LOW (ref 36.0–46.0)
Hemoglobin: 11.2 g/dL — ABNORMAL LOW (ref 12.0–15.0)
MCH: 29.2 pg (ref 26.0–34.0)
MCHC: 32.6 g/dL (ref 30.0–36.0)
MCV: 89.8 fL (ref 78.0–100.0)
Platelets: 227 10*3/uL (ref 150–400)
RBC: 3.83 MIL/uL — ABNORMAL LOW (ref 3.87–5.11)
RDW: 13.3 % (ref 11.5–15.5)
WBC: 3.2 10*3/uL — ABNORMAL LOW (ref 4.0–10.5)

## 2015-03-06 LAB — COMPREHENSIVE METABOLIC PANEL
ALT: 43 U/L — ABNORMAL HIGH (ref 0–35)
AST: 24 U/L (ref 0–37)
Albumin: 3.5 g/dL (ref 3.5–5.2)
Alkaline Phosphatase: 38 U/L — ABNORMAL LOW (ref 39–117)
Anion gap: 8 (ref 5–15)
BUN: 9 mg/dL (ref 6–23)
CO2: 27 mmol/L (ref 19–32)
Calcium: 9.2 mg/dL (ref 8.4–10.5)
Chloride: 104 mmol/L (ref 96–112)
Creatinine, Ser: 0.72 mg/dL (ref 0.50–1.10)
GFR calc Af Amer: >90 mL/min (ref >90)
GFR calc non Af Amer: >90 mL/min (ref >90)
Glucose, Bld: 101 mg/dL — ABNORMAL HIGH (ref 70–99)
Potassium: 4.1 mmol/L (ref 3.5–5.1)
Sodium: 139 mmol/L (ref 135–145)
Total Bilirubin: 0.4 mg/dL (ref 0.3–1.2)
Total Protein: 6.3 g/dL (ref 6.0–8.3)

## 2015-03-06 LAB — HCG, QUANTITATIVE, PREGNANCY: hCG, Beta Chain, Quant, S: <1 m[IU]/mL (ref <5)

## 2015-03-07 ENCOUNTER — Ambulatory Visit (HOSPITAL_COMMUNITY): Payer: BLUE CROSS/BLUE SHIELD | Admitting: Anesthesiology

## 2015-03-07 ENCOUNTER — Encounter (HOSPITAL_COMMUNITY)
Admission: RE | Disposition: A | Discharge status: Home / Self Care | Payer: Self-pay | Source: Ambulatory Visit | Attending: Obstetrics & Gynecology

## 2015-03-07 ENCOUNTER — Encounter (HOSPITAL_COMMUNITY): Payer: Self-pay | Admitting: *Deleted

## 2015-03-07 ENCOUNTER — Ambulatory Visit (HOSPITAL_COMMUNITY)
Admission: RE | Admit: 2015-03-07 | Discharge: 2015-03-07 | Disposition: A | Discharge status: Home / Self Care | Payer: BLUE CROSS/BLUE SHIELD | Source: Ambulatory Visit | Attending: Obstetrics & Gynecology | Admitting: Obstetrics & Gynecology

## 2015-03-07 DIAGNOSIS — Z302 Encounter for sterilization: Secondary | ICD-10-CM | POA: Insufficient documentation

## 2015-03-07 DIAGNOSIS — Z641 Problems related to multiparity: Secondary | ICD-10-CM | POA: Insufficient documentation

## 2015-03-07 DIAGNOSIS — I10 Essential (primary) hypertension: Secondary | ICD-10-CM | POA: Insufficient documentation

## 2015-03-07 DIAGNOSIS — F419 Anxiety disorder, unspecified: Secondary | ICD-10-CM | POA: Insufficient documentation

## 2015-03-07 DIAGNOSIS — F329 Major depressive disorder, single episode, unspecified: Secondary | ICD-10-CM | POA: Insufficient documentation

## 2015-03-07 DIAGNOSIS — Z4002 Encounter for prophylactic removal of ovary: Secondary | ICD-10-CM | POA: Insufficient documentation

## 2015-03-07 DIAGNOSIS — K219 Gastro-esophageal reflux disease without esophagitis: Secondary | ICD-10-CM | POA: Insufficient documentation

## 2015-03-07 HISTORY — PX: LAPAROSCOPIC BILATERAL SALPINGECTOMY: SHX5889

## 2015-03-07 SURGERY — SALPINGECTOMY, BILATERAL, LAPAROSCOPIC
Anesthesia: General | Laterality: Bilateral

## 2015-03-07 MED ORDER — ONDANSETRON HCL 4 MG/2ML IJ SOLN
4.0000 mg | Freq: Once | INTRAMUSCULAR | Status: AC | PRN
  Administered 2015-03-07: 4 mg via INTRAVENOUS
  Filled 2015-03-07: qty 2

## 2015-03-07 MED ORDER — ONDANSETRON HCL 4 MG/2ML IJ SOLN
INTRAMUSCULAR | Status: AC
  Filled 2015-03-07: qty 2

## 2015-03-07 MED ORDER — SODIUM CHLORIDE 0.9 % IR SOLN
Status: DC | PRN
  Administered 2015-03-07: 1000 mL

## 2015-03-07 MED ORDER — ROCURONIUM BROMIDE 100 MG/10ML IV SOLN
INTRAVENOUS | Status: DC | PRN
  Administered 2015-03-07: 5 mg via INTRAVENOUS
  Administered 2015-03-07: 30 mg via INTRAVENOUS

## 2015-03-07 MED ORDER — MIDAZOLAM HCL 2 MG/2ML IJ SOLN
INTRAMUSCULAR | Status: AC
  Filled 2015-03-07: qty 2

## 2015-03-07 MED ORDER — MIDAZOLAM HCL 2 MG/2ML IJ SOLN
1.0000 mg | INTRAMUSCULAR | Status: DC | PRN
Start: 2015-03-07 — End: 2015-03-07
  Administered 2015-03-07: 2 mg via INTRAVENOUS

## 2015-03-07 MED ORDER — BUPIVACAINE LIPOSOME 1.3 % IJ SUSP
INTRAMUSCULAR | Status: AC
  Filled 2015-03-07: qty 20

## 2015-03-07 MED ORDER — FENTANYL CITRATE 0.05 MG/ML IJ SOLN
INTRAMUSCULAR | Status: AC
  Filled 2015-03-07: qty 5

## 2015-03-07 MED ORDER — BUPIVACAINE LIPOSOME 1.3 % IJ SUSP
INTRAMUSCULAR | Status: DC | PRN
  Administered 2015-03-07: 20 mL

## 2015-03-07 MED ORDER — LIDOCAINE HCL (CARDIAC) 10 MG/ML IV SOLN
INTRAVENOUS | Status: DC | PRN
  Administered 2015-03-07: 20 mg via INTRAVENOUS

## 2015-03-07 MED ORDER — GLYCOPYRROLATE 0.2 MG/ML IJ SOLN
0.2000 mg | Freq: Once | INTRAMUSCULAR | Status: AC
  Administered 2015-03-07: 0.2 mg via INTRAVENOUS

## 2015-03-07 MED ORDER — GLYCOPYRROLATE 0.2 MG/ML IJ SOLN
INTRAMUSCULAR | Status: AC
  Filled 2015-03-07: qty 3

## 2015-03-07 MED ORDER — LACTATED RINGERS IV SOLN
INTRAVENOUS | Status: DC
  Administered 2015-03-07: 1000 mL via INTRAVENOUS

## 2015-03-07 MED ORDER — KETOROLAC TROMETHAMINE 30 MG/ML IJ SOLN
30.0000 mg | Freq: Once | INTRAMUSCULAR | Status: AC
  Administered 2015-03-07: 30 mg via INTRAVENOUS

## 2015-03-07 MED ORDER — FENTANYL CITRATE 0.05 MG/ML IJ SOLN: 25.0000 ug | INTRAMUSCULAR | Status: DC | PRN

## 2015-03-07 MED ORDER — KETOROLAC TROMETHAMINE 30 MG/ML IJ SOLN
INTRAMUSCULAR | Status: AC
  Filled 2015-03-07: qty 1

## 2015-03-07 MED ORDER — PROPOFOL 10 MG/ML IV BOLUS
INTRAVENOUS | Status: AC
  Filled 2015-03-07: qty 20

## 2015-03-07 MED ORDER — MIDAZOLAM HCL 2 MG/2ML IJ SOLN
INTRAMUSCULAR | Status: DC | PRN
  Administered 2015-03-07: 2 mg via INTRAVENOUS

## 2015-03-07 MED ORDER — FENTANYL CITRATE 0.05 MG/ML IJ SOLN
INTRAMUSCULAR | Status: DC | PRN
  Administered 2015-03-07: 50 ug via INTRAVENOUS
  Administered 2015-03-07: 100 ug via INTRAVENOUS
  Administered 2015-03-07: 50 ug via INTRAVENOUS

## 2015-03-07 MED ORDER — ONDANSETRON HCL 4 MG/2ML IJ SOLN
4.0000 mg | Freq: Once | INTRAMUSCULAR | Status: AC
  Administered 2015-03-07: 4 mg via INTRAVENOUS

## 2015-03-07 MED ORDER — PROPOFOL 10 MG/ML IV BOLUS
INTRAVENOUS | Status: DC | PRN
  Administered 2015-03-07: 150 mg via INTRAVENOUS
  Administered 2015-03-07: 50 mg via INTRAVENOUS

## 2015-03-07 MED ORDER — GLYCOPYRROLATE 0.2 MG/ML IJ SOLN
INTRAMUSCULAR | Status: AC
  Filled 2015-03-07: qty 1

## 2015-03-07 MED ORDER — DEXAMETHASONE SODIUM PHOSPHATE 10 MG/ML IJ SOLN
INTRAMUSCULAR | Status: DC | PRN
  Administered 2015-03-07: 4 mg via INTRAVENOUS

## 2015-03-07 MED ORDER — KETOROLAC TROMETHAMINE 10 MG PO TABS: 10.0000 mg | ORAL_TABLET | Freq: Three times a day (TID) | ORAL | Status: DC | PRN

## 2015-03-07 MED ORDER — HYDROCODONE-ACETAMINOPHEN 5-325 MG PO TABS: 1.0000 | ORAL_TABLET | Freq: Four times a day (QID) | ORAL | Status: DC | PRN

## 2015-03-07 MED ORDER — CEFAZOLIN SODIUM-DEXTROSE 2-3 GM-% IV SOLR
INTRAVENOUS | Status: AC
  Filled 2015-03-07: qty 50

## 2015-03-07 MED ORDER — CEFAZOLIN SODIUM-DEXTROSE 2-3 GM-% IV SOLR
2.0000 g | INTRAVENOUS | Status: AC
  Administered 2015-03-07: 2 g via INTRAVENOUS

## 2015-03-07 MED ORDER — ROCURONIUM BROMIDE 50 MG/5ML IV SOLN
INTRAVENOUS | Status: AC
  Filled 2015-03-07: qty 1

## 2015-03-07 MED ORDER — SODIUM CHLORIDE 0.9 % IJ SOLN
INTRAMUSCULAR | Status: AC
  Filled 2015-03-07: qty 10

## 2015-03-07 MED ORDER — LIDOCAINE HCL (PF) 1 % IJ SOLN
INTRAMUSCULAR | Status: AC
  Filled 2015-03-07: qty 5

## 2015-03-07 MED ORDER — GLYCOPYRROLATE 0.2 MG/ML IJ SOLN
INTRAMUSCULAR | Status: DC | PRN
  Administered 2015-03-07: .8 mg via INTRAVENOUS

## 2015-03-07 MED ORDER — NEOSTIGMINE METHYLSULFATE 10 MG/10ML IV SOLN
INTRAVENOUS | Status: AC
  Filled 2015-03-07: qty 1

## 2015-03-07 MED ORDER — ONDANSETRON HCL 8 MG PO TABS: 8.0000 mg | ORAL_TABLET | Freq: Three times a day (TID) | ORAL | Status: DC | PRN

## 2015-03-07 MED ORDER — NEOSTIGMINE METHYLSULFATE 10 MG/10ML IV SOLN
INTRAVENOUS | Status: DC | PRN
  Administered 2015-03-07: 4 mg via INTRAVENOUS

## 2015-03-07 MED ORDER — DEXAMETHASONE SODIUM PHOSPHATE 4 MG/ML IJ SOLN
INTRAMUSCULAR | Status: AC
  Filled 2015-03-07: qty 1

## 2015-03-07 SURGICAL SUPPLY — 48 items
APPLIER CLIP 5 13 M/L LIGAMAX5 (MISCELLANEOUS)
APR CLP MED LRG 5 ANG JAW (MISCELLANEOUS)
BAG HAMPER (MISCELLANEOUS) ×3 IMPLANT
BLADE SURG SZ11 CARB STEEL (BLADE) ×3 IMPLANT
CLIP APPLIE 5 13 M/L LIGAMAX5 (MISCELLANEOUS) IMPLANT
CLOTH BEACON ORANGE TIMEOUT ST (SAFETY) ×3 IMPLANT
COVER LIGHT HANDLE STERIS (MISCELLANEOUS) ×6 IMPLANT
DRAPE PROXIMA HALF (DRAPES) ×3 IMPLANT
ELECT REM PT RETURN 9FT ADLT (ELECTROSURGICAL) ×3
ELECTRODE REM PT RTRN 9FT ADLT (ELECTROSURGICAL) ×1 IMPLANT
FILTER SMOKE EVAC LAPAROSHD (FILTER) ×3 IMPLANT
FORMALIN 10 PREFIL 480ML (MISCELLANEOUS) ×3 IMPLANT
GAUZE SPONGE 4X4 16PLY XRAY LF (GAUZE/BANDAGES/DRESSINGS) IMPLANT
GLOVE BIO SURGEON STRL SZ 6.5 (GLOVE) ×1 IMPLANT
GLOVE BIO SURGEONS STRL SZ 6.5 (GLOVE) ×1
GLOVE BIOGEL PI IND STRL 7.0 (GLOVE) IMPLANT
GLOVE BIOGEL PI IND STRL 8 (GLOVE) ×1 IMPLANT
GLOVE BIOGEL PI INDICATOR 7.0 (GLOVE) ×4
GLOVE BIOGEL PI INDICATOR 8 (GLOVE) ×2
GLOVE ECLIPSE 8.0 STRL XLNG CF (GLOVE) ×3 IMPLANT
GLOVE EXAM NITRILE MD LF STRL (GLOVE) ×4 IMPLANT
GOWN STRL REUS W/TWL LRG LVL3 (GOWN DISPOSABLE) ×3 IMPLANT
GOWN STRL REUS W/TWL XL LVL3 (GOWN DISPOSABLE) ×3 IMPLANT
INST SET LAPROSCOPIC GYN AP (KITS) ×3 IMPLANT
IV NS IRRIG 3000ML ARTHROMATIC (IV SOLUTION) IMPLANT
KIT ROOM TURNOVER APOR (KITS) ×3 IMPLANT
MANIFOLD NEPTUNE II (INSTRUMENTS) ×3 IMPLANT
NDL INSUFFLATION 14GA 120MM (NEEDLE) ×1 IMPLANT
NEEDLE INSUFFLATION 14GA 120MM (NEEDLE) ×3 IMPLANT
PACK PERI GYN (CUSTOM PROCEDURE TRAY) ×3 IMPLANT
PAD ARMBOARD 7.5X6 YLW CONV (MISCELLANEOUS) ×3 IMPLANT
SCALPEL HARMONIC ACE (MISCELLANEOUS) ×3 IMPLANT
SET BASIN LINEN APH (SET/KITS/TRAYS/PACK) ×3 IMPLANT
SET TUBE IRRIG SUCTION NO TIP (IRRIGATION / IRRIGATOR) IMPLANT
SLEEVE ENDOPATH XCEL 5M (ENDOMECHANICALS) ×3 IMPLANT
SOLUTION ANTI FOG 6CC (MISCELLANEOUS) ×3 IMPLANT
SPONGE GAUZE 2X2 8PLY STER LF (GAUZE/BANDAGES/DRESSINGS) ×3
SPONGE GAUZE 2X2 8PLY STRL LF (GAUZE/BANDAGES/DRESSINGS) ×3 IMPLANT
STAPLER VISISTAT 35W (STAPLE) ×3 IMPLANT
SUT VICRYL 0 UR6 27IN ABS (SUTURE) ×3 IMPLANT
SYR 20CC LL (SYRINGE) ×3 IMPLANT
SYRINGE 10CC LL (SYRINGE) ×3 IMPLANT
TAPE CLOTH SURG 4X10 WHT LF (GAUZE/BANDAGES/DRESSINGS) ×2 IMPLANT
TRAY FOLEY CATH 16FR SILVER (SET/KITS/TRAYS/PACK) ×3 IMPLANT
TROCAR ENDO BLADELESS 11MM (ENDOMECHANICALS) ×3 IMPLANT
TROCAR XCEL NON-BLD 5MMX100MML (ENDOMECHANICALS) ×3 IMPLANT
TUBING INSUF HEATED (TUBING) ×3 IMPLANT
WARMER LAPAROSCOPE (MISCELLANEOUS) ×3 IMPLANT

## 2015-03-07 NOTE — Discharge Instructions (Signed)
Laparoscopic Tubal Ligation °Care After °Refer to this sheet in the next few weeks. These instructions provide you with information on caring for yourself after your procedure. Your caregiver may also give you more specific instructions. Your treatment has been planned according to current medical practices, but problems sometimes occur. Call your caregiver if you have any problems or questions after your procedure. °HOME CARE INSTRUCTIONS  °· Rest the remainder of the day. °· Only take over-the-counter or prescription medicines for pain, discomfort, or fever as directed by your caregiver. Do not take aspirin. It can cause bleeding. °· Gradually resume daily activities, diet, rest, driving, and work. °· Avoid sexual intercourse for 2 weeks or as directed. °· Do not use tampons or douche. °· Do not drive while taking pain medicine. °· Do not lift anything over 5 pounds for 2 weeks or as directed. °· Only take showers, not baths, until you are seen by your caregiver. °· Change bandages (dressings) as directed. °· Take your temperature twice a day and record it. °· Try to have help for the first 7 to 10 days for your household needs. °· Return to your caregiver to get your stitches (sutures) removed and for follow-up visits as directed. °SEEK MEDICAL CARE IF:  °· You have redness, swelling, or increasing pain in a wound. °· You have drainage from a wound lasting longer than 1 day. °· Your pain is getting worse. °· You have a rash. °· You become dizzy or lightheaded. °· You have a reaction to your medicine. °· You need stronger medicine or a change in your pain medicine. °· You notice a bad smell coming from a wound or dressing. °· Your wound breaks open after the sutures have been removed. °· You are constipated. °SEEK IMMEDIATE MEDICAL CARE IF:  °· You faint. °· You have a fever. °· You have increasing abdominal pain. °· You have severe pain in your shoulders. °· You have bleeding or drainage from the suture sites or  vagina following surgery. °· You have shortness of breath or difficulty breathing. °· You have chest or leg pain. °· You have persistent nausea, vomiting, or diarrhea. °MAKE SURE YOU:  °· Understand these instructions. °· Watch your condition. °· Get help right away if you are not doing well or get worse. °Document Released: 06/06/2005 Document Revised: 05/18/2012 Document Reviewed: 02/28/2012 °[employer]® Patient Information ©2015 [employer]. This information is not intended to replace advice given to you by your health care provider. Make sure you discuss any questions you have with your health care provider. ° °

## 2015-03-07 NOTE — Transfer of Care (Signed)
Immediate Anesthesia Transfer of Care Note  Patient: Miranda Oliver  Procedure(s) Performed: Procedure(s): LAPAROSCOPIC BILATERAL SALPINGECTOMY (Bilateral)  Patient Location: PACU  Anesthesia Type:General  Level of Consciousness: awake and patient cooperative  Airway & Oxygen Therapy: Patient Spontanous Breathing and non-rebreather face mask  Post-op Assessment: Report given to RN, Post -op Vital signs reviewed and stable and Patient moving all extremities  Post vital signs: Reviewed and stable   Complications: No apparent anesthesia complications

## 2015-03-07 NOTE — Anesthesia Preprocedure Evaluation (Signed)
Anesthesia Evaluation  Patient identified by MRN, date of birth, ID band Patient awake    Reviewed: Allergy & Precautions, H&P , NPO status , Patient's Chart, lab work & pertinent test results  Airway Mallampati: II  TM Distance: >3 FB Neck ROM: full    Dental  (+) Teeth Intact   Pulmonary neg pulmonary ROS,  breath sounds clear to auscultation        Cardiovascular hypertension (PIH - resolved), Rhythm:regular Rate:Normal     Neuro/Psych PSYCHIATRIC DISORDERS Anxiety Depression    GI/Hepatic GERD-  Medicated,  Endo/Other  Morbid obesity  Renal/GU      Musculoskeletal   Abdominal   Peds  Hematology   Anesthesia Other Findings   Reproductive/Obstetrics                             Anesthesia Physical Anesthesia Plan  ASA: II  Anesthesia Plan: General   Post-op Pain Management:    Induction: Intravenous, Rapid sequence and Cricoid pressure planned  Airway Management Planned: Oral ETT  Additional Equipment:   Intra-op Plan:   Post-operative Plan: Extubation in OR  Informed Consent: I have reviewed the patients History and Physical, chart, labs and discussed the procedure including the risks, benefits and alternatives for the proposed anesthesia with the patient or authorized representative who has indicated his/her understanding and acceptance.     Plan Discussed with:   Anesthesia Plan Comments:         Anesthesia Quick Evaluation

## 2015-03-07 NOTE — Op Note (Signed)
Preoperative Diagnosis:  1.  Multiparous female desires permanent sterilization                                          2.  Elects to have bilateral salpingectomy for ovarian cancer prophylaxis  Postoperative Diagnosis:  Same as above  Procedure:  Laparoscopic Bilateral Salpingectomy  Surgeon:  Rockne CoonsLuther H Eure  Jr MD  Anaesthesia: general  Findings:  Patient had normal pelvic anatomy and no intraperitoneal abnormalities.  Description of Operation:  Patient was taken to the OR and placed into supine position where she underwent general anaesthesia.  She was placed in the dorsal lithotomy position and prepped and draped in the usual sterile fashion.  An incision was made in the umbilicus and dissection taken down to the rectus fascia which was incised and opened.  The non bladed trocar was then placed and the peritoneal cavity was insufflated.  The above noted findings were observed.  Additional trocars were placed in the right and left lower quadrants under direct visualization without difficulty.  The Harmonic scalpel was employed and salpingectomy of both the right and left tubes was performed.   The tubes were removed from the peritoneal cavity and sent to pathology.  There was good hemostasis bilaterally.  The fascia, peritoneum and subcutaneous tissue were closed using 0 vicryl.  The skin was closed using staples.  Exparel 266 mg 20 cc was injected in the 3 incision trocar sites. The patient was awakened from anaesthesia and taken to the PACU with all counts being correct x 3.  The patient received  2 gram of ancef andToradol 30 mg IV preoperatively.  EURE,LUTHER H 03/07/2015 11:24 AM

## 2015-03-07 NOTE — Anesthesia Postprocedure Evaluation (Signed)
Anesthesia Post Note  Patient: Miranda KaysKimberly N Schweiger  Procedure(s) Performed: Procedure(s) (LRB): LAPAROSCOPIC BILATERAL SALPINGECTOMY (Bilateral)  Anesthesia type: General  Patient location: PACU  Post pain: Pain level controlled  Post assessment: Post-op Vital signs reviewed, Patient's Cardiovascular Status Stable, Respiratory Function Stable, Patent Airway, No signs of Nausea or vomiting and Pain level controlled   Post vital signs: Reviewed and stable  Level of consciousness: awake and alert   Complications: No apparent anesthesia complications

## 2015-03-07 NOTE — H&P (Signed)
Preoperative History and Physical  Miranda Oliver is a 34 y.o. [phone] with No LMP recorded. admitted for a laparoscopic bilateral salpingectomy for sterilization.  Desires salpingectomy  PMH:  Past Medical History  Diagnosis Date  . Anxiety   . GERD (gastroesophageal reflux disease)   . Depression     PSH:  Past Surgical History  Procedure Laterality Date  . Dilation and curettage of uterus  2013 & 2014     MAB x2     POb/GynH:  OB History    Gravida Para Term Preterm AB TAB SAB Ectopic Multiple Living   '4 2 2 '$ 0 2 0 2 0 0 2      SH:  History  Substance Use Topics  . Smoking status: Never Smoker   . Smokeless tobacco: Never Used  . Alcohol Use: No     Comment: occassional     FH:  Family History  Problem Relation Age of Onset  . Stroke Mother   . Hypertension Mother   . Diabetes Mother   . Hypertension Sister   . Hypertension Father      Allergies: No Known Allergies  Medications:  Current outpatient prescriptions:  . acetaminophen (TYLENOL) 500 MG tablet, Take 1,000 mg by mouth every 6 (six) hours as needed for mild pain. , Disp: , Rfl:  . Prenatal Vit-Fe Fumarate-FA (PRENATAL MULTIVITAMIN) TABS tablet, Take 1 tablet by mouth 2 (two) times daily. , Disp: , Rfl:  . triamterene-hydrochlorothiazide (MAXZIDE-25) 37.5-25 MG per tablet, Take 1 tablet by mouth daily. (Patient not taking: Reported on 02/22/2015), Disp: 30 tablet, Rfl: 2  Review of Systems:   Review of Systems  Constitutional: Negative for fever, chills, weight loss, malaise/fatigue and diaphoresis.  HENT: Negative for hearing loss, ear pain, nosebleeds, congestion, sore throat, neck pain, tinnitus and ear discharge.  Eyes: Negative for blurred vision, double vision, photophobia, pain, discharge and redness.  Respiratory: Negative for cough, hemoptysis, sputum production, shortness  of breath, wheezing and stridor.  Cardiovascular: Negative for chest pain, palpitations, orthopnea, claudication, leg swelling and PND.  Gastrointestinal: Positive for abdominal pain. Negative for heartburn, nausea, vomiting, diarrhea, constipation, blood in stool and melena.  Genitourinary: Negative for dysuria, urgency, frequency, hematuria and flank pain.  Musculoskeletal: Negative for myalgias, back pain, joint pain and falls.  Skin: Negative for itching and rash.  Neurological: Negative for dizziness, tingling, tremors, sensory change, speech change, focal weakness, seizures, loss of consciousness, weakness and headaches.  Endo/Heme/Allergies: Negative for environmental allergies and polydipsia. Does not bruise/bleed easily.  Psychiatric/Behavioral: Negative for depression, suicidal ideas, hallucinations, memory loss and substance abuse. The patient is not nervous/anxious and does not have insomnia.     PHYSICAL EXAM:  Blood pressure 116/70, pulse 76, weight 234 lb (106.142 kg), currently breastfeeding.   Vitals reviewed. Constitutional: She is oriented to person, place, and time. She appears well-developed and well-nourished.  HENT:  Head: Normocephalic and atraumatic.  Right Ear: External ear normal.  Left Ear: External ear normal.  Nose: Nose normal.  Mouth/Throat: Oropharynx is clear and moist.  Eyes: Conjunctivae and EOM are normal. Pupils are equal, round, and reactive to light. Right eye exhibits no discharge. Left eye exhibits no discharge. No scleral icterus.  Neck: Normal range of motion. Neck supple. No tracheal deviation present. No thyromegaly present.  Cardiovascular: Normal rate, regular rhythm, normal heart sounds and intact distal pulses. Exam reveals no gallop and no friction rub.  No murmur heard. Respiratory: Effort normal and breath sounds normal. No respiratory  distress. She has no wheezes. She has no rales. She exhibits no tenderness.  GI: Soft.  Bowel sounds are normal. She exhibits no distension and no mass. There is tenderness. There is no rebound and no guarding.  Genitourinary:   Vulva is normal without lesions Vagina is pink moist without discharge Cervix normal in appearance and pap is normal Uterus is normal Adnexa is negative with normal sized ovaries by sonogram  Musculoskeletal: Normal range of motion. She exhibits no edema and no tenderness.  Neurological: She is alert and oriented to person, place, and time. She has normal reflexes. She displays normal reflexes. No cranial nerve deficit. She exhibits normal muscle tone. Coordination normal.  Skin: Skin is warm and dry. No rash noted. No erythema. No pallor.  Psychiatric: She has a normal mood and affect. Her behavior is normal. Judgment and thought content normal.    Labs: Recent Results (from the past 2160 hour(s))  POCT urinalysis dipstick     Status: None   Collection Time: 12/14/14 11:03 AM  Result Value Ref Range   Color, UA yellow    Clarity, UA clear    Glucose, UA neg    Bilirubin, UA     Ketones, UA neg    Spec Grav, UA     Blood, UA neg    pH, UA     Protein, UA neg    Urobilinogen, UA     Nitrite, UA neg    Leukocytes, UA Negative   POCT urinalysis dipstick     Status: None   Collection Time: 12/27/14 11:17 AM  Result Value Ref Range   Color, UA     Clarity, UA     Glucose, UA neg    Bilirubin, UA     Ketones, UA neg    Spec Grav, UA     Blood, UA neg    pH, UA     Protein, UA neg    Urobilinogen, UA     Nitrite, UA neg    Leukocytes, UA Negative   POCT urinalysis dipstick     Status: None   Collection Time: 01/03/15  9:20 AM  Result Value Ref Range   Color, UA yellow    Clarity, UA clear    Glucose, UA neg    Bilirubin, UA     Ketones, UA neg    Spec Grav, UA     Blood, UA neg    pH, UA     Protein, UA 1    Urobilinogen, UA     Nitrite, UA neg    Leukocytes, UA Negative   Protein / creatinine ratio, urine     Status:  None   Collection Time: 01/03/15 12:45 PM  Result Value Ref Range   Creatinine, Urine 109.00 mg/dL   Total Protein, Urine 7 mg/dL    Comment: NO NORMAL RANGE ESTABLISHED FOR THIS TEST   Protein Creatinine Ratio 0.06 0.00 - 0.15  CBC     Status: Abnormal   Collection Time: 01/03/15 12:50 PM  Result Value Ref Range   WBC 4.1 4.0 - 10.5 K/uL   RBC 3.69 (L) 3.87 - 5.11 MIL/uL   Hemoglobin 10.8 (L) 12.0 - 15.0 g/dL   HCT 74.0 (L) 45.7 - 63.5 %   MCV 88.1 78.0 - 100.0 fL   MCH 29.3 26.0 - 34.0 pg   MCHC 33.2 30.0 - 36.0 g/dL   RDW 81.8 25.6 - 77.3 %   Platelets 195 150 - 400 K/uL  Comprehensive metabolic panel     Status: Abnormal   Collection Time: 01/03/15 12:50 PM  Result Value Ref Range   Sodium 135 135 - 145 mmol/L   Potassium 3.4 (L) 3.5 - 5.1 mmol/L   Chloride 107 96 - 112 mmol/L   CO2 21 19 - 32 mmol/L   Glucose, Bld 133 (H) 70 - 99 mg/dL   BUN 9 6 - 23 mg/dL   Creatinine, Ser 0.65 0.50 - 1.10 mg/dL   Calcium 8.6 8.4 - 10.5 mg/dL   Total Protein 6.2 6.0 - 8.3 g/dL   Albumin 3.2 (L) 3.5 - 5.2 g/dL   AST 21 0 - 37 U/L   ALT 14 0 - 35 U/L   Alkaline Phosphatase 126 (H) 39 - 117 U/L   Total Bilirubin 0.3 0.3 - 1.2 mg/dL   GFR calc non Af Amer >90 >90 mL/min   GFR calc Af Amer >90 >90 mL/min    Comment: (NOTE) The eGFR has been calculated using the CKD EPI equation. This calculation has not been validated in all clinical situations. eGFR's persistently <90 mL/min signify possible Chronic Kidney Disease.    Anion gap 7 5 - 15  POCT urinalysis dipstick     Status: None   Collection Time: 01/05/15  9:01 AM  Result Value Ref Range   Color, UA     Clarity, UA     Glucose, UA neg    Bilirubin, UA     Ketones, UA neg    Spec Grav, UA     Blood, UA neg    pH, UA     Protein, UA trace    Urobilinogen, UA     Nitrite, UA neg    Leukocytes, UA Negative   OB RESULT CONSOLE Group B Strep     Status: None   Collection Time: 01/12/15 12:00 AM  Result Value Ref Range   GBS  Negative   POCT urinalysis dipstick     Status: None   Collection Time: 01/12/15 11:04 AM  Result Value Ref Range   Color, UA     Clarity, UA     Glucose, UA neg    Bilirubin, UA     Ketones, UA neg    Spec Grav, UA     Blood, UA neg    pH, UA     Protein, UA trace    Urobilinogen, UA     Nitrite, UA neg    Leukocytes, UA Negative   Culture, beta strep (group b only)     Status: None   Collection Time: 01/12/15  1:40 PM  Result Value Ref Range   Strep Gp B Culture Negative Negative    Comment: Penicillin G, ampicillin, or cefazolin are indicated for intrapartum prophylaxis of perinatal group B Strep (GBS) colonization. Reflex susceptibility testing should be performed prior to use of clindamycin only on GBS isolates from penicillin-allergic women who are considered a high risk for anaphylaxis. Treatment with vancomycin without additional testing is warranted if resistance to clindamycin is noted. (CDC Guidelines, MMWR, Nov. 19, 2010)   GC/Chlamydia Probe Amp     Status: None   Collection Time: 01/12/15  1:41 PM  Result Value Ref Range   Chlamydia trachomatis, NAA Negative Negative   Neisseria gonorrhoeae by PCR Negative Negative   PLEASE NOTE: Comment     Comment: Acceptable specimens for this test are female urethral swab, endocervical swab and liquid based pap specimens, vaginal swabs in [facility] transports and first void  urine. See online Directory of Services for test number for rectal and pharyngeal specimens.   POCT urinalysis dipstick     Status: None   Collection Time: 01/16/15  9:41 AM  Result Value Ref Range   Color, UA yellow    Clarity, UA clear    Glucose, UA neg    Bilirubin, UA     Ketones, UA neg    Spec Grav, UA     Blood, UA neg    pH, UA     Protein, UA trace    Urobilinogen, UA     Nitrite, UA neg    Leukocytes, UA Negative   CBC     Status: Abnormal   Collection Time: 01/16/15  3:59 PM  Result Value Ref Range   WBC 4.1 4.0 - 10.5 K/uL   RBC  3.93 3.87 - 5.11 MIL/uL   Hemoglobin 11.8 (L) 12.0 - 15.0 g/dL   HCT 34.5 (L) 36.0 - 46.0 %   MCV 87.8 78.0 - 100.0 fL   MCH 30.0 26.0 - 34.0 pg   MCHC 34.2 30.0 - 36.0 g/dL   RDW 13.5 11.5 - 15.5 %   Platelets 221 150 - 400 K/uL  RPR     Status: None   Collection Time: 01/16/15  3:59 PM  Result Value Ref Range   RPR Ser Ql Non Reactive Non Reactive    Comment: (NOTE) Performed At: [facility] [place], [place] [id number] Lindon Romp MD [id number]   Comprehensive metabolic panel     Status: Abnormal   Collection Time: 01/16/15  3:59 PM  Result Value Ref Range   Sodium 136 135 - 145 mmol/L   Potassium 4.2 3.5 - 5.1 mmol/L   Chloride 109 96 - 112 mmol/L   CO2 23 19 - 32 mmol/L   Glucose, Bld 79 70 - 99 mg/dL   BUN 11 6 - 23 mg/dL   Creatinine, Ser 0.64 0.50 - 1.10 mg/dL   Calcium 9.0 8.4 - 10.5 mg/dL   Total Protein 6.5 6.0 - 8.3 g/dL   Albumin 3.2 (L) 3.5 - 5.2 g/dL   AST 21 0 - 37 U/L   ALT 15 0 - 35 U/L   Alkaline Phosphatase 159 (H) 39 - 117 U/L   Total Bilirubin 0.3 0.3 - 1.2 mg/dL   GFR calc non Af Amer >90 >90 mL/min   GFR calc Af Amer >90 >90 mL/min    Comment: (NOTE) The eGFR has been calculated using the CKD EPI equation. This calculation has not been validated in all clinical situations. eGFR's persistently <90 mL/min signify possible Chronic Kidney Disease.    Anion gap 4 (L) 5 - 15  Type and screen     Status: None   Collection Time: 01/16/15  4:00 PM  Result Value Ref Range   ABO/RH(D) O POS    Antibody Screen NEG    Sample Expiration 01/19/2015   ABO/Rh     Status: None   Collection Time: 01/16/15  4:05 PM  Result Value Ref Range   ABO/RH(D) O POS   Protein / creatinine ratio, urine     Status: Abnormal   Collection Time: 01/16/15  4:55 PM  Result Value Ref Range   Creatinine, Urine 273.00 mg/dL   Total Protein, Urine 74 mg/dL    Comment: NO NORMAL RANGE ESTABLISHED FOR THIS TEST   Protein Creatinine Ratio 0.27 (H)  0.00 - 0.15  CBC     Status: Abnormal  Collection Time: 01/16/15 10:40 PM  Result Value Ref Range   WBC 5.5 4.0 - 10.5 K/uL   RBC 3.89 3.87 - 5.11 MIL/uL   Hemoglobin 11.4 (L) 12.0 - 15.0 g/dL   HCT 34.5 (L) 36.0 - 46.0 %   MCV 88.7 78.0 - 100.0 fL   MCH 29.3 26.0 - 34.0 pg   MCHC 33.0 30.0 - 36.0 g/dL   RDW 13.6 11.5 - 15.5 %   Platelets 215 150 - 400 K/uL  CBC     Status: Abnormal   Collection Time: 01/17/15 12:12 PM  Result Value Ref Range   WBC 6.3 4.0 - 10.5 K/uL   RBC 3.78 (L) 3.87 - 5.11 MIL/uL   Hemoglobin 11.3 (L) 12.0 - 15.0 g/dL   HCT 33.6 (L) 36.0 - 46.0 %   MCV 88.9 78.0 - 100.0 fL   MCH 29.9 26.0 - 34.0 pg   MCHC 33.6 30.0 - 36.0 g/dL   RDW 13.8 11.5 - 15.5 %   Platelets 188 150 - 400 K/uL  CBC     Status: Abnormal   Collection Time: 01/18/15  5:45 AM  Result Value Ref Range   WBC 9.1 4.0 - 10.5 K/uL   RBC 3.43 (L) 3.87 - 5.11 MIL/uL   Hemoglobin 10.3 (L) 12.0 - 15.0 g/dL   HCT 30.7 (L) 36.0 - 46.0 %   MCV 89.5 78.0 - 100.0 fL   MCH 30.0 26.0 - 34.0 pg   MCHC 33.6 30.0 - 36.0 g/dL   RDW 13.7 11.5 - 15.5 %   Platelets 181 150 - 400 K/uL  CBC     Status: None   Collection Time: 01/25/15 11:55 AM  Result Value Ref Range   WBC 3.5 3.4 - 10.8 x10E3/uL   RBC 4.09 3.77 - 5.28 x10E6/uL   Hemoglobin 11.8 11.1 - 15.9 g/dL   HCT 35.5 34.0 - 46.6 %   MCV 87 79 - 97 fL   MCH 28.9 26.6 - 33.0 pg   MCHC 33.2 31.5 - 35.7 g/dL   RDW 14.8 12.3 - 15.4 %   Platelets 287 150 - 379 x10E3/uL  Comprehensive metabolic panel     Status: Abnormal   Collection Time: 01/25/15 11:55 AM  Result Value Ref Range   Glucose 85 65 - 99 mg/dL   BUN 10 6 - 20 mg/dL   Creatinine, Ser 0.87 0.57 - 1.00 mg/dL   GFR calc non Af Amer 89 >59 mL/min/1.73   GFR calc Af Amer 103 >59 mL/min/1.73   BUN/Creatinine Ratio 11 8 - 20   Sodium 143 134 - 144 mmol/L   Potassium 4.1 3.5 - 5.2 mmol/L   Chloride 101 97 - 108 mmol/L   CO2 25 18 - 29 mmol/L   Calcium 9.8 8.7 - 10.2 mg/dL   Total  Protein 6.5 6.0 - 8.5 g/dL   Albumin 4.0 3.5 - 5.5 g/dL   Globulin, Total 2.5 1.5 - 4.5 g/dL   Albumin/Globulin Ratio 1.6 1.1 - 2.5   Bilirubin Total 0.2 0.0 - 1.2 mg/dL   Alkaline Phosphatase 86 39 - 117 IU/L   AST 32 0 - 40 IU/L   ALT 39 (H) 0 - 32 IU/L  POCT urinalysis dipstick     Status: None   Collection Time: 01/31/15 11:17 AM  Result Value Ref Range   Color, UA     Clarity, UA     Glucose, UA neg    Bilirubin, UA     Ketones,  UA neg    Spec Grav, UA     Blood, UA neg    pH, UA     Protein, UA neg    Urobilinogen, UA     Nitrite, UA neg    Leukocytes, UA Negative   CBC     Status: Abnormal   Collection Time: 03/06/15  8:40 AM  Result Value Ref Range   WBC 3.2 (L) 4.0 - 10.5 K/uL   RBC 3.83 (L) 3.87 - 5.11 MIL/uL   Hemoglobin 11.2 (L) 12.0 - 15.0 g/dL   HCT 34.4 (L) 36.0 - 46.0 %   MCV 89.8 78.0 - 100.0 fL   MCH 29.2 26.0 - 34.0 pg   MCHC 32.6 30.0 - 36.0 g/dL   RDW 13.3 11.5 - 15.5 %   Platelets 227 150 - 400 K/uL  Comprehensive metabolic panel     Status: Abnormal   Collection Time: 03/06/15  8:40 AM  Result Value Ref Range   Sodium 139 135 - 145 mmol/L   Potassium 4.1 3.5 - 5.1 mmol/L   Chloride 104 96 - 112 mmol/L   CO2 27 19 - 32 mmol/L   Glucose, Bld 101 (H) 70 - 99 mg/dL   BUN 9 6 - 23 mg/dL   Creatinine, Ser 0.72 0.50 - 1.10 mg/dL   Calcium 9.2 8.4 - 10.5 mg/dL   Total Protein 6.3 6.0 - 8.3 g/dL   Albumin 3.5 3.5 - 5.2 g/dL   AST 24 0 - 37 U/L   ALT 43 (H) 0 - 35 U/L   Alkaline Phosphatase 38 (L) 39 - 117 U/L   Total Bilirubin 0.4 0.3 - 1.2 mg/dL   GFR calc non Af Amer >90 >90 mL/min   GFR calc Af Amer >90 >90 mL/min    Comment: (NOTE) The eGFR has been calculated using the CKD EPI equation. This calculation has not been validated in all clinical situations. eGFR's persistently <90 mL/min signify possible Chronic Kidney Disease.    Anion gap 8 5 - 15  hCG, quantitative, pregnancy     Status: None   Collection Time: 03/06/15  8:40 AM  Result  Value Ref Range   hCG, Beta Chain, Quant, S <1 <5 mIU/mL    Comment:          GEST. AGE      CONC.  (mIU/mL)   <=1 WEEK        5 - 50     2 WEEKS       50 - 500     3 WEEKS       100 - 10,000     4 WEEKS     1,000 - 30,000     5 WEEKS     3,500 - 115,000   6-8 WEEKS     12,000 - 270,000    12 WEEKS     15,000 - 220,000        FEMALE AND NON-PREGNANT FEMALE:     LESS THAN 5 mIU/mL   Urinalysis, Routine w reflex microscopic     Status: None   Collection Time: 03/06/15  8:40 AM  Result Value Ref Range   Color, Urine YELLOW YELLOW   APPearance CLEAR CLEAR   Specific Gravity, Urine 1.020 1.005 - 1.030   pH 6.0 5.0 - 8.0   Glucose, UA NEGATIVE NEGATIVE mg/dL   Hgb urine dipstick NEGATIVE NEGATIVE   Bilirubin Urine NEGATIVE NEGATIVE   Ketones, ur NEGATIVE NEGATIVE mg/dL   Protein,  ur NEGATIVE NEGATIVE mg/dL   Urobilinogen, UA 0.2 0.0 - 1.0 mg/dL   Nitrite NEGATIVE NEGATIVE   Leukocytes, UA NEGATIVE NEGATIVE    Comment: MICROSCOPIC NOT DONE ON URINES WITH NEGATIVE PROTEIN, BLOOD, LEUKOCYTES, NITRITE, OR GLUCOSE <1000 mg/dL.    EKG: No orders found for this or any previous visit.  Imaging Studies:  Imaging Results    No results found.      Assessment: Desires permanent sterilization Patient Active Problem List   Diagnosis Date Noted  . Pregnancy 01/16/2015  . Chronic hypertension 01/03/2015  . Gestational hypertension   . Encounter for fetal anatomic survey   . [redacted] weeks gestation of pregnancy   . Obesity 06/27/2014  . General counseling and advice for procreative management 02/15/2014    Plan: Laparoscopic bilateral salpingectomy for sterilization      Jessalyn Hinojosa H 03/07/2015 10:07 AM

## 2015-03-07 NOTE — Anesthesia Procedure Notes (Signed)
Procedure Name: Intubation Date/Time: 03/07/2015 10:38 AM Performed by: Franco NonesYATES, Vanecia Limpert S Pre-anesthesia Checklist: Patient identified, Patient being monitored, Timeout performed, Emergency Drugs available and Suction available Patient Re-evaluated:Patient Re-evaluated prior to inductionOxygen Delivery Method: Circle System Utilized Preoxygenation: Pre-oxygenation with 100% oxygen Intubation Type: IV induction, Rapid sequence and Cricoid Pressure applied Ventilation: Mask ventilation without difficulty Laryngoscope Size: Miller and 2 Grade View: Grade I Tube type: Oral Tube size: 7.0 mm Number of attempts: 1 Airway Equipment and Method: Stylet Placement Confirmation: ETT inserted through vocal cords under direct vision,  positive ETCO2 and breath sounds checked- equal and bilateral Secured at: 21 cm Tube secured with: Tape Dental Injury: Teeth and Oropharynx as per pre-operative assessment

## 2015-03-08 ENCOUNTER — Encounter (HOSPITAL_COMMUNITY): Payer: Self-pay | Admitting: Obstetrics & Gynecology

## 2015-03-15 ENCOUNTER — Encounter: Payer: Self-pay | Admitting: Obstetrics & Gynecology

## 2015-03-15 ENCOUNTER — Ambulatory Visit (INDEPENDENT_AMBULATORY_CARE_PROVIDER_SITE_OTHER): Payer: BLUE CROSS/BLUE SHIELD | Admitting: Obstetrics & Gynecology

## 2015-03-15 VITALS — BP 132/84 | HR 68 | Ht 62.0 in | Wt 233.5 lb

## 2015-03-15 DIAGNOSIS — Z9889 Other specified postprocedural states: Secondary | ICD-10-CM

## 2015-03-15 NOTE — Progress Notes (Signed)
Patient ID: Miranda Oliver, female   DOB: [birth year], 34 y.o.   MRN: [id number]  HPI: Patient returns for routine postoperative follow-up having undergone laparoscopic salpingectomy for sterilization on 03/07/2015.  The patient's immediate postoperative recovery has been unremarkable. Since hospital discharge the patient reports no problems.   Current Outpatient Prescriptions: acetaminophen (TYLENOL) 500 MG tablet, Take 1,000 mg by mouth every 6 (six) hours as needed for mild pain. , Disp: , Rfl:  HYDROcodone-acetaminophen (NORCO/VICODIN) 5-325 MG per tablet, Take 1 tablet by mouth every 6 (six) hours as needed. (Patient not taking: Reported on 03/15/2015), Disp: 30 tablet, Rfl: 0  No current facility-administered medications for this visit.    Blood pressure 132/84, pulse 68, height 5\' 2"  (1.575 m), weight 233 lb 8 oz (105.915 kg), not currently breastfeeding.  Physical Exam: Incisions x3 normal Abdomen non tender  Diagnostic Tests: none  Pathology: benign  Impression: Normal post op course for laparoscopic salpingectomy  Plan: Yearly   Follow up: 6  months  Lazaro ArmsEURE,LUTHER H, MD

## 2015-07-11 ENCOUNTER — Other Ambulatory Visit: Payer: Self-pay | Admitting: *Deleted

## 2015-08-14 ENCOUNTER — Ambulatory Visit: Payer: BLUE CROSS/BLUE SHIELD | Admitting: Obstetrics & Gynecology

## 2015-08-14 ENCOUNTER — Encounter: Payer: Self-pay | Admitting: Obstetrics & Gynecology
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
## 2018-07-02 ENCOUNTER — Encounter (HOSPITAL_COMMUNITY): Payer: Self-pay | Admitting: Psychiatry

## 2018-07-02 ENCOUNTER — Ambulatory Visit (INDEPENDENT_AMBULATORY_CARE_PROVIDER_SITE_OTHER): Payer: BLUE CROSS/BLUE SHIELD | Admitting: Psychiatry

## 2018-07-02 VITALS — BP 118/84 | HR 66 | Ht 62.0 in | Wt 253.0 lb

## 2018-07-02 DIAGNOSIS — F411 Generalized anxiety disorder: Secondary | ICD-10-CM

## 2018-07-02 MED ORDER — SERTRALINE HCL 50 MG PO TABS: ORAL_TABLET | ORAL | 0 refills | Status: DC

## 2018-07-02 NOTE — Patient Instructions (Signed)
1. Start sertraline 25 mg daily for one week, then 50 mg daily  2. Continue clonazepam 0.5 mg twice a day as needed for anxiety  3. Referral to therapy  4. Ask your primary care doctor if you have checked TSH (thyroid) 5. Return to clinic in one month for 30 mins

## 2018-08-09 ENCOUNTER — Other Ambulatory Visit (HOSPITAL_COMMUNITY): Payer: Self-pay | Admitting: Psychiatry

## 2018-08-09 MED ORDER — SERTRALINE HCL 50 MG PO TABS: 50.0000 mg | ORAL_TABLET | Freq: Every day | ORAL | 0 refills | Status: DC
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
## 2018-08-13 ENCOUNTER — Ambulatory Visit (HOSPITAL_COMMUNITY): Payer: Self-pay | Admitting: Psychiatry

## 2018-09-03 ENCOUNTER — Ambulatory Visit (HOSPITAL_COMMUNITY): Payer: Self-pay | Admitting: Psychiatry

## 2019-08-04 ENCOUNTER — Emergency Department (HOSPITAL_COMMUNITY)
Admission: EM | Admit: 2019-08-04 | Discharge: 2019-08-04 | Disposition: A | Discharge status: Home / Self Care | Payer: Self-pay | Attending: Emergency Medicine | Admitting: Emergency Medicine

## 2019-08-04 ENCOUNTER — Encounter (HOSPITAL_COMMUNITY): Payer: Self-pay | Admitting: Emergency Medicine

## 2019-08-04 ENCOUNTER — Other Ambulatory Visit: Payer: Self-pay

## 2019-08-04 DIAGNOSIS — R42 Dizziness and giddiness: Secondary | ICD-10-CM | POA: Insufficient documentation

## 2019-08-04 DIAGNOSIS — I10 Essential (primary) hypertension: Secondary | ICD-10-CM | POA: Insufficient documentation

## 2019-08-04 DIAGNOSIS — Z79899 Other long term (current) drug therapy: Secondary | ICD-10-CM | POA: Insufficient documentation

## 2019-08-04 DIAGNOSIS — R030 Elevated blood-pressure reading, without diagnosis of hypertension: Secondary | ICD-10-CM

## 2019-08-04 LAB — CBC WITH DIFFERENTIAL/PLATELET
Abs Immature Granulocytes: 0 10*3/uL (ref 0.00–0.07)
Basophils Absolute: 0 10*3/uL (ref 0.0–0.1)
Basophils Relative: 1 %
Eosinophils Absolute: 0.1 10*3/uL (ref 0.0–0.5)
Eosinophils Relative: 4 %
HCT: 40.3 % (ref 36.0–46.0)
Hemoglobin: 12.5 g/dL (ref 12.0–15.0)
Immature Granulocytes: 0 %
Lymphocytes Relative: 55 %
Lymphs Abs: 2 10*3/uL (ref 0.7–4.0)
MCH: 29.1 pg (ref 26.0–34.0)
MCHC: 31 g/dL (ref 30.0–36.0)
MCV: 93.7 fL (ref 80.0–100.0)
Monocytes Absolute: 0.3 10*3/uL (ref 0.1–1.0)
Monocytes Relative: 10 %
Neutro Abs: 1 10*3/uL — ABNORMAL LOW (ref 1.7–7.7)
Neutrophils Relative %: 30 %
Platelets: 252 10*3/uL (ref 150–400)
RBC: 4.3 MIL/uL (ref 3.87–5.11)
RDW: 13.1 % (ref 11.5–15.5)
WBC: 3.5 10*3/uL — ABNORMAL LOW (ref 4.0–10.5)
nRBC: 0 % (ref 0.0–0.2)

## 2019-08-04 LAB — URINALYSIS, ROUTINE W REFLEX MICROSCOPIC
Bilirubin Urine: NEGATIVE
Glucose, UA: NEGATIVE mg/dL
Hgb urine dipstick: NEGATIVE
Ketones, ur: NEGATIVE mg/dL
Leukocytes,Ua: NEGATIVE
Nitrite: NEGATIVE
Protein, ur: NEGATIVE mg/dL
Specific Gravity, Urine: 1.028 (ref 1.005–1.030)
pH: 6 (ref 5.0–8.0)

## 2019-08-04 LAB — BASIC METABOLIC PANEL
Anion gap: 7 (ref 5–15)
BUN: 13 mg/dL (ref 6–20)
CO2: 24 mmol/L (ref 22–32)
Calcium: 9.1 mg/dL (ref 8.9–10.3)
Chloride: 104 mmol/L (ref 98–111)
Creatinine, Ser: 0.73 mg/dL (ref 0.44–1.00)
GFR calc Af Amer: >60 mL/min (ref >60)
GFR calc non Af Amer: >60 mL/min (ref >60)
Glucose, Bld: 89 mg/dL (ref 70–99)
Potassium: 3.7 mmol/L (ref 3.5–5.1)
Sodium: 135 mmol/L (ref 135–145)

## 2019-08-04 LAB — CBG MONITORING, ED: Glucose-Capillary: 145 mg/dL — ABNORMAL HIGH (ref 70–99)

## 2019-08-04 LAB — POC URINE PREG, ED: Preg Test, Ur: NEGATIVE

## 2019-08-04 NOTE — ED Provider Notes (Signed)
[facility] EMERGENCY DEPARTMENT Provider Note   CSN: [id number] Arrival date & time: 08/04/19  1338     History   Chief Complaint Chief Complaint  Patient presents with  . Dizziness    HPI Miranda Oliver is a 38 y.o. female with a history of htn, GERD, anxiety presenting with an episode of dizziness that occurred around 11 AM this morning.  She was 4 hours into her work shift, describes working in a physical position in a hot environment and states she got increasingly hot, diaphoretic and became lightheaded.  She went to sit down and was evaluated by the plant RN who is concerned about heat related illness and was sent for further evaluation.  She went to an urgent care center and they felt she was dehydrated and was referred here.  She currently denies any symptoms and feels completely well.  She does endorse having a snack for breakfast before her shift, tries to drink during her shift to stay hydrated but had decreased fluid intake today.  She felt much better after resting in a cool environment and after eating lunch.  She denies have been headache, chest pain, palpitations, shortness of breath, also denies myalgias or muscle spasm.     The history is provided by the patient.    Past Medical History:  Diagnosis Date  . Anxiety   . Depression   . GERD (gastroesophageal reflux disease)     Patient Active Problem List   Diagnosis Date Noted  . Chronic hypertension 01/03/2015  . Gestational hypertension   . Encounter for fetal anatomic survey   . Obesity 06/27/2014  . General counseling and advice for procreative management 02/15/2014    Past Surgical History:  Procedure Laterality Date  . DILATION AND CURETTAGE OF UTERUS  2013 & 2014    MAB x2   . LAPAROSCOPIC BILATERAL SALPINGECTOMY Bilateral 03/07/2015   Procedure: LAPAROSCOPIC BILATERAL SALPINGECTOMY;  Surgeon: Lazaro ArmsLuther H Eure, MD;  Location: [facility];  Service: Gynecology;  Laterality: Bilateral;     OB History    Gravida  4   Para  2   Term  2   Preterm  0   AB  2   Living  2     SAB  2   TAB  0   Ectopic  0   Multiple  0   Live Births  2            Home Medications    Prior to Admission medications   Medication Sig Start Date End Date Taking? Authorizing Provider  clonazePAM (KLONOPIN) 0.5 MG tablet TAKE ONE TABLET BY MOUTH ONCE DAILY AS NEEDED FOR ANXIETY 06/22/18  Yes [provider]  acetaminophen (TYLENOL) 500 MG tablet Take 1,000 mg by mouth every 6 (six) hours as needed for mild pain.     [provider]  sertraline (ZOLOFT) 50 MG tablet Take 1 tablet (50 mg total) by mouth daily. Patient not taking: Reported on 08/04/2019 08/09/18   Neysa HotterHisada, Reina, MD    Family History Family History  Problem Relation Age of Onset  . Stroke Mother   . Hypertension Mother   . Diabetes Mother   . Depression Mother   . Hypertension Sister   . Depression Sister   . Hypertension Father     Social History Social History   Tobacco Use  . Smoking status: Never Smoker  . Smokeless tobacco: Never Used  Substance Use Topics  . Alcohol use: Yes  Comment: occassional   . Drug use: No     Allergies   Patient has no known allergies.   Review of Systems Review of Systems  Constitutional: Positive for diaphoresis. Negative for fever.  HENT: Negative for congestion and sore throat.   Eyes: Negative.   Respiratory: Negative for chest tightness and shortness of breath.   Cardiovascular: Negative for chest pain and palpitations.  Gastrointestinal: Negative for abdominal pain, nausea and vomiting.  Genitourinary: Negative.   Musculoskeletal: Negative for arthralgias, joint swelling and neck pain.  Skin: Negative.  Negative for rash and wound.  Neurological: Positive for light-headedness. Negative for dizziness, weakness, numbness and headaches.  Psychiatric/Behavioral: Negative.      Physical Exam Updated Vital Signs BP (!) 156/97 (BP Location: Left Arm)    Pulse 84   Temp 98.3 F (36.8 C) (Oral)   Resp 20   Ht 5\' 2"  (1.575 m)   Wt 117.9 kg   LMP  (Approximate)   SpO2 100%   BMI 47.55 kg/m   Physical Exam Vitals signs and nursing note reviewed.  Constitutional:      Appearance: She is well-developed.  HENT:     Head: Normocephalic and atraumatic.     Mouth/Throat:     Mouth: Mucous membranes are moist.  Eyes:     Extraocular Movements: Extraocular movements intact.     Conjunctiva/sclera: Conjunctivae normal.     Pupils: Pupils are equal, round, and reactive to light.  Neck:     Musculoskeletal: Normal range of motion.  Cardiovascular:     Rate and Rhythm: Normal rate and regular rhythm.     Heart sounds: Normal heart sounds.  Pulmonary:     Effort: Pulmonary effort is normal.     Breath sounds: Normal breath sounds. No wheezing.  Abdominal:     General: Bowel sounds are normal.     Palpations: Abdomen is soft.     Tenderness: There is no abdominal tenderness.  Musculoskeletal: Normal range of motion.  Skin:    General: Skin is warm and dry.  Neurological:     General: No focal deficit present.     Mental Status: She is alert and oriented to person, place, and time.      ED Treatments / Results  Labs (all labs ordered are listed, but only abnormal results are displayed) Labs Reviewed  CBC WITH DIFFERENTIAL/PLATELET - Abnormal; Notable for the following components:      Result Value   WBC 3.5 (*)    Neutro Abs 1.0 (*)    All other components within normal limits  URINALYSIS, ROUTINE W REFLEX MICROSCOPIC - Abnormal; Notable for the following components:   APPearance HAZY (*)    All other components within normal limits  CBG MONITORING, ED - Abnormal; Notable for the following components:   Glucose-Capillary 145 (*)    All other components within normal limits  BASIC METABOLIC PANEL  POC URINE PREG, ED    EKG EKG Interpretation  Date/Time:  Thursday August 04 2019 15:49:03 EDT Ventricular Rate:  83 PR  Interval:    QRS Duration: 82 QT Interval:  355 QTC Calculation: 418 R Axis:   78 Text Interpretation:  Sinus rhythm Borderline T abnormalities, anterior leads No old tracing to compare Confirmed by Aletta Edouard [id number]) on 08/04/2019 4:02:42 PM   Radiology No results found.  Procedures Procedures (including critical care time)  Medications Ordered in ED Medications - No data to display   Initial Impression / Assessment and Plan /  ED Course  I have reviewed the triage vital signs and the nursing notes.  Pertinent labs & imaging results that were available during my care of the patient were reviewed by me and considered in my medical decision making (see chart for details).        Labs and EKG reviewed and are reassuring today.  Patient remained symptom-free during her ED visit today.  We discussed her elevated blood pressure.  She states that she has never been treated for blood pressure, and is generally very well controlled unless she is anxious.  She was advised to recheck her blood pressure once or twice over the next week and to follow-up with her PCP if she has additional elevated blood pressures.  She was encouraged to increase her fluid intake, especially while at work and to avoid long periods of time between calorie intake.  PRN follow-up anticipated.  Final Clinical Impressions(s) / ED Diagnoses   Final diagnoses:  Dizziness  Elevated blood pressure reading    ED Discharge Orders    None       Victoriano Lain 08/04/19 2110    Terrilee Files, MD 08/05/19 1049

## 2019-08-04 NOTE — ED Triage Notes (Signed)
Report jobs area is hot and she got dizziness this am.  Says she felt better after siting down.  Pt went to Urgent Care and sent to ED d/t BP changes.  Denies any discomfort or pain at this time.

## 2019-08-04 NOTE — ED Notes (Signed)
Patient denies any dizzines upon sitting or standing

## 2019-08-04 NOTE — Discharge Instructions (Signed)
Your lab tests, exam and ekg (heart tracing) is reassuring with no abnormal findings.  Make sure you are drinking plenty of healthy beverages while at work to avoid dehydration.  Heating a better breakfast may also help you get through your work morning.  Your blood pressure is elevated tonight and should be rechecked this week.  Plan to followup with your doctor for any residual symptoms or if you continue to measure elevated blood pressures.

## 2019-11-28 ENCOUNTER — Other Ambulatory Visit: Payer: Self-pay

## 2019-11-28 ENCOUNTER — Ambulatory Visit: Payer: BC Managed Care – PPO | Attending: Internal Medicine

## 2019-11-28 DIAGNOSIS — Z20822 Contact with and (suspected) exposure to covid-19: Secondary | ICD-10-CM

## 2019-11-30 LAB — NOVEL CORONAVIRUS, NAA: SARS-CoV-2, NAA: NOT DETECTED

## 2020-02-24 ENCOUNTER — Ambulatory Visit: Payer: Self-pay | Attending: Internal Medicine

## 2020-02-24 DIAGNOSIS — Z23 Encounter for immunization: Secondary | ICD-10-CM

## 2020-02-24 NOTE — Progress Notes (Signed)
   Covid-19 Vaccination Clinic  Name:  CLARAMAE RIGDON    MRN: [id number] DOB: Jul 28, 1981  02/24/2020  Ms. Bundren was observed post Covid-19 immunization for 15 minutes without incident. She was provided with Vaccine Information Sheet and instruction to access the V-Safe system.   Ms. Inclan was instructed to call 911 with any severe reactions post vaccine: Marland Kitchen Difficulty breathing  . Swelling of face and throat  . A fast heartbeat  . A bad rash all over body  . Dizziness and weakness   Immunizations Administered    Name Date Dose VIS Date Route   Moderna COVID-19 Vaccine 02/24/2020  8:58 AM 0.5 mL 11/01/2019 Intramuscular   Manufacturer: Moderna   Lot: [id number]   NDC: 58682-574-93

## 2020-03-28 ENCOUNTER — Ambulatory Visit: Payer: Self-pay

## 2020-04-06 ENCOUNTER — Ambulatory Visit: Payer: Self-pay | Attending: Internal Medicine

## 2020-04-06 DIAGNOSIS — Z23 Encounter for immunization: Secondary | ICD-10-CM

## 2020-04-06 NOTE — Progress Notes (Signed)
   Covid-19 Vaccination Clinic  Name:  Miranda Oliver    MRN: [id number] DOB: 1981/08/23  04/06/2020  Miranda Oliver was observed post Covid-19 immunization for 15 minutes without incident. She was provided with Vaccine Information Sheet and instruction to access the V-Safe system.   Miranda Oliver was instructed to call 911 with any severe reactions post vaccine: Marland Kitchen Difficulty breathing  . Swelling of face and throat  . A fast heartbeat  . A bad rash all over body  . Dizziness and weakness   Immunizations Administered    Name Date Dose VIS Date Route   Moderna COVID-19 Vaccine 04/06/2020  9:59 AM 0.5 mL 11/2019 Intramuscular   Manufacturer: Moderna   Lot: [id number]   NDC: 93570-177-93

## 2020-09-28 ENCOUNTER — Encounter: Payer: Self-pay | Admitting: Women's Health

## 2020-09-28 ENCOUNTER — Other Ambulatory Visit (HOSPITAL_COMMUNITY)
Admission: RE | Admit: 2020-09-28 | Discharge: 2020-09-28 | Disposition: A | Discharge status: Home / Self Care | Payer: Managed Care, Other (non HMO) | Source: Ambulatory Visit | Attending: Advanced Practice Midwife | Admitting: Advanced Practice Midwife

## 2020-09-28 ENCOUNTER — Ambulatory Visit (INDEPENDENT_AMBULATORY_CARE_PROVIDER_SITE_OTHER): Payer: Managed Care, Other (non HMO) | Admitting: Women's Health

## 2020-09-28 ENCOUNTER — Other Ambulatory Visit: Payer: Self-pay

## 2020-09-28 VITALS — BP 150/84 | HR 91 | Ht 62.0 in | Wt 252.4 lb

## 2020-09-28 DIAGNOSIS — N926 Irregular menstruation, unspecified: Secondary | ICD-10-CM

## 2020-09-28 DIAGNOSIS — B9689 Other specified bacterial agents as the cause of diseases classified elsewhere: Secondary | ICD-10-CM | POA: Diagnosis not present

## 2020-09-28 DIAGNOSIS — N760 Acute vaginitis: Secondary | ICD-10-CM | POA: Insufficient documentation

## 2020-09-28 DIAGNOSIS — Z3009 Encounter for other general counseling and advice on contraception: Secondary | ICD-10-CM | POA: Diagnosis not present

## 2020-09-28 LAB — POCT URINE PREGNANCY: Preg Test, Ur: NEGATIVE

## 2020-09-28 MED ORDER — MEGESTROL ACETATE 40 MG PO TABS: ORAL_TABLET | ORAL | 1 refills | Status: DC

## 2020-09-28 NOTE — Progress Notes (Signed)
   GYN VISIT Patient name: Miranda Oliver MRN [id number]  Date of birth: [birth year] Chief Complaint:   Dysmenorrhea (Irregular periods, varying bleeding, change BC)  History of Present Illness:   Miranda Oliver is a 39 y.o. [phone] African American female being seen today for report of prolonged bleeding. Periods normally once/mth x 5d. Started on sprintec in June for cramps. Started a period in Sept and hasn't stopped. Some clots and cramping. At heaviest changes pad q 2hrs. Went to [facility] on Monday d/t bleeding, Hgb 11.9. Some vulvar itching-feels like it is from all of the bleeding. Denies abnormal discharge, odor/irritation.  Had bilateral salpingectomy in 2016.   Depression screen PHQ 2/9 09/28/2020  Decreased Interest 1  Down, Depressed, Hopeless 0  PHQ - 2 Score 1  Altered sleeping 1  Tired, decreased energy 1  Change in appetite 0  Feeling bad or failure about yourself  0  Trouble concentrating 0  Moving slowly or fidgety/restless 0  Suicidal thoughts 0  PHQ-9 Score 3    No LMP recorded. (Menstrual status: Irregular Periods). The current method of family planning is bilateral salpingectomy Last pap July 2021 w/ PCP. Results were:  normal Review of Systems:   Pertinent items are noted in HPI Denies fever/chills, dizziness, headaches, visual disturbances, fatigue, shortness of breath, chest pain, abdominal pain, vomiting, abnormal vaginal discharge/itching/odor/irritation, problems with periods, bowel movements, urination, or intercourse unless otherwise stated above.  Pertinent History Reviewed:  Reviewed past medical,surgical, social, obstetrical and family history.  Reviewed problem list, medications and allergies. Physical Assessment:   Vitals:   09/28/20 1244  BP: (!) 150/84  Pulse: 91  Weight: 252 lb 6.4 oz (114.5 kg)  Height: 5\' 2"  (1.575 m)  Body mass index is 46.16 kg/m.       Physical Examination:   General appearance: alert, well appearing, and in no  distress  Mental status: alert, oriented to person, place, and time  Skin: warm & dry   Cardiovascular: normal heart rate noted  Respiratory: normal respiratory effort, no distress  Abdomen: soft, non-tender   Pelvic: VULVA: normal appearing vulva with no masses, tenderness or lesions, VAGINA: normal appearing vagina with normal color and discharge, no lesions, mod amt menstrual blood CERVIX: normal appearing cervix without discharge or lesions, UTERUS: uterus is normal size, shape, consistency and nontender, ADNEXA: normal adnexa in size, nontender and no masses  Extremities: no edema   Chaperone:    Results for orders placed or performed in visit on 09/28/20 (from the past 24 hour(s))  POCT urine pregnancy   Collection Time: 09/28/20 12:39 PM  Result Value Ref Range   Preg Test, Ur Negative Negative    Assessment & Plan:  1) Prolonged period> will get pelvic u/s, CV swab sent, stop sprintec, rx megace  Meds:  Meds ordered this encounter  Medications  . megestrol (MEGACE) 40 MG tablet    Sig: 3x5d, 2x5d, then 1 daily to help control vaginal bleeding. Stop taking when bleeding stops.    Dispense:  45 tablet    Refill:  1    Order Specific Question:   Supervising Provider    Answer:   09/30/20 H [[id number]]    Orders Placed This Encounter  Procedures  . Duane Lope PELVIS (TRANSABDOMINAL ONLY)  . US PELVIS TRANSVAGINAL NON-OB (TV ONLY)  . POCT urine pregnancy    Return for 1st available, US:GYN and f/u w/ me after.  YANIQUE MULVIHILL CNM, [facility] 09/28/2020 1:04 PM

## 2020-10-01 ENCOUNTER — Other Ambulatory Visit: Payer: Self-pay | Admitting: Women's Health

## 2020-10-01 LAB — CERVICOVAGINAL ANCILLARY ONLY
Bacterial Vaginitis (gardnerella): POSITIVE — AB
Candida Glabrata: NEGATIVE
Candida Vaginitis: NEGATIVE
Chlamydia: NEGATIVE
Comment: NEGATIVE
Comment: NEGATIVE
Comment: NEGATIVE
Comment: NEGATIVE
Comment: NEGATIVE
Comment: NORMAL
Neisseria Gonorrhea: NEGATIVE
Trichomonas: NEGATIVE

## 2020-10-01 MED ORDER — METRONIDAZOLE 500 MG PO TABS: 500.0000 mg | ORAL_TABLET | Freq: Two times a day (BID) | ORAL | 0 refills | Status: DC

## 2020-10-04 ENCOUNTER — Encounter: Payer: Self-pay | Admitting: Advanced Practice Midwife

## 2020-10-22 ENCOUNTER — Ambulatory Visit (INDEPENDENT_AMBULATORY_CARE_PROVIDER_SITE_OTHER): Payer: Managed Care, Other (non HMO) | Admitting: Women's Health

## 2020-10-22 ENCOUNTER — Other Ambulatory Visit: Payer: Self-pay

## 2020-10-22 ENCOUNTER — Encounter: Payer: Self-pay | Admitting: Women's Health

## 2020-10-22 ENCOUNTER — Ambulatory Visit (INDEPENDENT_AMBULATORY_CARE_PROVIDER_SITE_OTHER): Payer: Managed Care, Other (non HMO)

## 2020-10-22 VITALS — BP 141/88 | HR 106 | Ht 62.0 in | Wt 247.0 lb

## 2020-10-22 DIAGNOSIS — N926 Irregular menstruation, unspecified: Secondary | ICD-10-CM | POA: Diagnosis not present

## 2020-10-22 DIAGNOSIS — I10 Essential (primary) hypertension: Secondary | ICD-10-CM | POA: Diagnosis not present

## 2020-10-22 NOTE — Progress Notes (Signed)
GYN VISIT Patient name: Miranda Oliver MRN [id number]  Date of birth: Nov 04, 1981 Chief Complaint:   Follow-up ([place] today)  History of Present Illness:   Miranda Oliver is a 39 y.o. [phone] African American female being seen today for f/u after pelvic u/s for recent prolonged period.  I saw her on 10/29 and reported bleeding since Sept. CV swab + for BV, treated w/ flagyl. Was on Sprintec, instructed to stop and rx'd megace to stop bleeding. States it did. H/O bilateral salpingectomy in 2016. BP elevated again, has PCP, not currently on meds. States has had some stress lately, husband's mom is in hospital and not doing well.  Depression screen PHQ 2/9 09/28/2020  Decreased Interest 1  Down, Depressed, Hopeless 0  PHQ - 2 Score 1  Altered sleeping 1  Tired, decreased energy 1  Change in appetite 0  Feeling bad or failure about yourself  0  Trouble concentrating 0  Moving slowly or fidgety/restless 0  Suicidal thoughts 0  PHQ-9 Score 3    No LMP recorded. (Menstrual status: Irregular Periods). The current method of family planning is salpingectomy.  Last pap 2021 w/ PCP. Results were:  normal Review of Systems:   Pertinent items are noted in HPI Denies fever/chills, dizziness, headaches, visual disturbances, fatigue, shortness of breath, chest pain, abdominal pain, vomiting, abnormal vaginal discharge/itching/odor/irritation, problems with periods, bowel movements, urination, or intercourse unless otherwise stated above.  Pertinent History Reviewed:  Reviewed past medical,surgical, social, obstetrical and family history.  Reviewed problem list, medications and allergies. Physical Assessment:   Vitals:   10/22/20 1110  BP: (!) 141/88  Pulse: (!) 106  Weight: 247 lb (112 kg)  Height: 5\' 2"  (1.575 m)  Body mass index is 45.18 kg/m.       Physical Examination:   General appearance: alert, well appearing, and in no distress  Mental status: alert, oriented to person, place, and  time  Skin: warm & dry   Cardiovascular: normal heart rate noted  Respiratory: normal respiratory effort, no distress  Abdomen: soft, non-tender   Pelvic: examination not indicated  Extremities: no edema   Chaperone: N/A    TODAY'S PELVIC U/S: Miranda Oliver is a 39 y.o. 30 LMP 08/01/2020 She is here for a pelvic sonogram for prolonged periords.  Uterus                      8.4 x 4.7 x 5.4 cm, Total uterine volume 111cc,homogeneous axial positioned uterus,wnl  Endometrium          5.6 mm, symmetrical, wnl Right ovary             2.1 x 1.4 x 1.9 cm, wnl Left ovary                2.1 x 2.7 x 1.8 cm, wnl No free fluid   Technician Comments: PELVIC 10/01/2020 TA/TV: homogeneous axial positioned uterus,wnl,normal ovaries,EEC 5.6 mm,mult simple nabothian cysts,ovaries appear mobile,no pain during ultrasound,no free fluid  Chaperone Peggy [place] 10/22/2020 11:18 AM  No results found for this or any previous visit (from the past 24 hour(s)).  Assessment & Plan:  1) Recent prolonged bleeding> stopped w/ megace, also had BV and tx w/ flagyl. Normal pelvic u/s today. Pt just wants to wait to see how periods go over next few months. Will let me know if wants to try another form of birth control to help with periods.   2) Elevated  bp> not on meds, f/u w/ PCP  Meds: No orders of the defined types were placed in this encounter.   No orders of the defined types were placed in this encounter.   Return in about 1 year (around 10/22/2021) for Physical.  DARIONNA BANKE CNM, [facility] 10/22/2020 11:35 AM

## 2020-10-22 NOTE — Progress Notes (Signed)
PELVIC US TA/TV: homogeneous axial positioned uterus,wnl,normal ovaries,EEC 5.6 mm,mult simple nabothian cysts,ovaries appear mobile,no pain during ultrasound,no free fluid

## 2021-04-05 ENCOUNTER — Ambulatory Visit (HOSPITAL_COMMUNITY): Payer: Managed Care, Other (non HMO)

## 2021-04-05 ENCOUNTER — Encounter (HOSPITAL_COMMUNITY): Admission: RE | Disposition: A | Discharge status: Home / Self Care | Payer: Self-pay | Attending: Cardiology

## 2021-04-05 ENCOUNTER — Encounter (HOSPITAL_COMMUNITY): Payer: Self-pay | Admitting: Cardiology

## 2021-04-05 ENCOUNTER — Other Ambulatory Visit: Payer: Self-pay

## 2021-04-05 ENCOUNTER — Ambulatory Visit (HOSPITAL_COMMUNITY)
Admission: RE | Admit: 2021-04-05 | Discharge: 2021-04-05 | Disposition: A | Discharge status: Home / Self Care | Payer: Managed Care, Other (non HMO) | Source: Other Acute Inpatient Hospital | Attending: Cardiology | Admitting: Cardiology

## 2021-04-05 DIAGNOSIS — F419 Anxiety disorder, unspecified: Secondary | ICD-10-CM | POA: Insufficient documentation

## 2021-04-05 DIAGNOSIS — K219 Gastro-esophageal reflux disease without esophagitis: Secondary | ICD-10-CM | POA: Diagnosis not present

## 2021-04-05 DIAGNOSIS — I10 Essential (primary) hypertension: Secondary | ICD-10-CM | POA: Diagnosis not present

## 2021-04-05 DIAGNOSIS — I513 Intracardiac thrombosis, not elsewhere classified: Secondary | ICD-10-CM | POA: Diagnosis not present

## 2021-04-05 DIAGNOSIS — Z6841 Body Mass Index (BMI) 40.0 and over, adult: Secondary | ICD-10-CM | POA: Diagnosis not present

## 2021-04-05 DIAGNOSIS — R0789 Other chest pain: Secondary | ICD-10-CM | POA: Diagnosis not present

## 2021-04-05 DIAGNOSIS — R918 Other nonspecific abnormal finding of lung field: Secondary | ICD-10-CM

## 2021-04-05 HISTORY — PX: BUBBLE STUDY: SHX6837

## 2021-04-05 HISTORY — PX: TEE WITHOUT CARDIOVERSION: SHX5443

## 2021-04-05 SURGERY — ECHOCARDIOGRAM, TRANSESOPHAGEAL
Anesthesia: Moderate Sedation

## 2021-04-05 MED ORDER — MIDAZOLAM HCL (PF) 5 MG/ML IJ SOLN
INTRAMUSCULAR | Status: AC
  Filled 2021-04-05: qty 2

## 2021-04-05 MED ORDER — METOPROLOL TARTRATE 5 MG/5ML IV SOLN
INTRAVENOUS | Status: DC | PRN
  Administered 2021-04-05: 5 mg via INTRAVENOUS

## 2021-04-05 MED ORDER — MIDAZOLAM HCL (PF) 5 MG/ML IJ SOLN
2.0000 mg | INTRAMUSCULAR | Status: AC | PRN
  Administered 2021-04-05: 3 mg via INTRAVENOUS
  Administered 2021-04-05: 2 mg via INTRAVENOUS

## 2021-04-05 MED ORDER — METOPROLOL TARTRATE 5 MG/5ML IV SOLN
INTRAVENOUS | Status: AC
  Filled 2021-04-05: qty 5

## 2021-04-05 MED ORDER — FENTANYL CITRATE (PF) 100 MCG/2ML IJ SOLN
INTRAMUSCULAR | Status: AC
  Filled 2021-04-05: qty 4

## 2021-04-05 MED ORDER — SODIUM CHLORIDE 0.9 % IV SOLN
INTRAVENOUS | Status: DC
  Administered 2021-04-05: 500 mL via INTRAVENOUS

## 2021-04-05 MED ORDER — FENTANYL CITRATE (PF) 100 MCG/2ML IJ SOLN
25.0000 ug | INTRAMUSCULAR | Status: AC | PRN
  Administered 2021-04-05: 25 ug via INTRAVENOUS

## 2021-04-05 MED ORDER — SODIUM CHLORIDE 0.9 % IV SOLN: INTRAVENOUS | Status: DC

## 2021-04-05 NOTE — H&P (Signed)
CARDIOLOGY ADMIT NOTE   Patient ID: Miranda Oliver MRN: [id number] DOB/AGE: 04/21/1981 40 y.o.  Admit date: 04/05/2021 Primary Physician:  Miranda Peri, MD  Patient ID: Miranda Oliver, female    DOB: 08-17-1981, 40 y.o.   MRN: [id number]  No chief complaint on file.  HPI:    Miranda Oliver  is a 40 y.o. years African-Americanerican female patient with GERD, morbid obesity, anxiety presented to plan for hospital with chest pain, described as on and off, on the left side of the chest, worse on taking deep breath and also she notices chest discomfort to be worse when she eats.  She was ruled out for myocardial infarction, EKG did not reveal any acute abnormality, however D-dimer was positive.  She underwent CT scan of the chest to exclude pulmonary embolism, was incidentally found to have abnormal questionable pericardial cyst versus left atrial appendage thrombus/occlusion.  She was recommended MRI heart, however not available at the facility.  After discussions with her primary care physician Dr. Sherryll Oliver, he felt she was stable for discharge but needed urgent evaluation to exclude thrombotic structure in the left atrium and also to find out if she had any significant structural abnormality.  After review of her chart online, I felt that this could be done potentially in the outpatient basis without admission to the hospital and arranged TEE to be performed today.  Patient was discharged from  Past Medical History:  Diagnosis Date  . Anxiety   . Depression   . GERD (gastroesophageal reflux disease)    Past Surgical History:  Procedure Laterality Date  . DILATION AND CURETTAGE OF UTERUS  2013 & 2014    MAB x2   . LAPAROSCOPIC BILATERAL SALPINGECTOMY Bilateral 03/07/2015   Procedure: LAPAROSCOPIC BILATERAL SALPINGECTOMY;  Surgeon: Miranda Arms, MD;  Location: [facility];  Service: Gynecology;  Laterality: Bilateral;   Social History   Socioeconomic History  . Marital status: Married    Spouse  name: Not on file  . Number of children: Not on file  . Years of education: Not on file  . Highest education level: Not on file  Occupational History  . Not on file  Tobacco Use  . Smoking status: Never Smoker  . Smokeless tobacco: Never Used  Vaping Use  . Vaping Use: Never used  Substance and Sexual Activity  . Alcohol use: Yes    Comment: occassional   . Drug use: No  . Sexual activity: Yes    Birth control/protection: Surgical    Comment: tubal  Other Topics Concern  . Not on file  Social History Narrative   She lives with her husband of 11 years, two children (age 19 year old son, 42 year old daughter)   Work: Location manager, for one year   Social Determinants of Corporate investment banker Strain: Low Risk   . Difficulty of Paying Living Expenses: Not hard at all  Food Insecurity: No Food Insecurity  . Worried About Programme researcher, broadcasting/film/video in the Last Year: Never true  . Ran Out of Food in the Last Year: Never true  Transportation Needs: No Transportation Needs  . Lack of Transportation (Medical): No  . Lack of Transportation (Non-Medical): No  Physical Activity: Insufficiently Active  . Days of Exercise per Week: 2 days  . Minutes of Exercise per Session: 30 min  Stress: Stress Concern Present  . Feeling of Stress : Rather much  Social Connections: Moderately Integrated  . Frequency of Communication  with Friends and Family: More than three times a week  . Frequency of Social Gatherings with Friends and Family: Once a week  . Attends Religious Services: 1 to 4 times per year  . Active Member of Clubs or Organizations: No  . Attends Banker Meetings: Never  . Marital Status: Married  Catering manager Violence: Not At Risk  . Fear of Current or Ex-Partner: No  . Emotionally Abused: No  . Physically Abused: No  . Sexually Abused: No   Family History  Problem Relation Age of Onset  . Stroke Mother   . Hypertension Mother   . Diabetes Mother   .  Depression Mother   . Hypertension Sister   . Depression Sister   . Hypertension Father     ROS  Review of Systems  Cardiovascular: Positive for chest pain. Negative for dyspnea on exertion and leg swelling.  Gastrointestinal: Negative for melena.  All other systems reviewed and are negative.  Objective   Vitals with BMI 04/05/2021 10/22/2020 09/28/2020  Height 5\' 2"  5\' 2"  5\' 2"   Weight 246 lbs 247 lbs 252 lbs 6 oz  BMI 44.98 45.17 46.15  Systolic 152 141  Diastolic 98 88 84  Pulse 84 106 91      Physical Exam Constitutional:      General: She is not in acute distress.    Comments: Morbidly obese in no acute distress.  HENT:     Head: Atraumatic.  Eyes:     Extraocular Movements: Extraocular movements intact.  Cardiovascular:     Rate and Rhythm: Normal rate and regular rhythm.     Pulses:          Carotid pulses are 2+ on the right side and 2+ on the left side.      Dorsalis pedis pulses are 2+ on the right side and 2+ on the left side.       Posterior tibial pulses are 2+ on the right side and 2+ on the left side.     Heart sounds: Normal heart sounds. No murmur heard. No gallop.      Comments: Femoral and popliteal pulse difficult to feel due to patient's body habitus.  No leg edema. JVD difficult to see due to short neck. Pulmonary:     Effort: Pulmonary effort is normal.     Breath sounds: Normal breath sounds.  Abdominal:     General: Bowel sounds are normal.     Palpations: Abdomen is soft.     Comments: Obese. Pannus present  Musculoskeletal:        General: Normal range of motion.     Cervical back: Normal range of motion.     Right lower leg: No edema.     Left lower leg: No edema.  Skin:    General: Skin is warm.     Capillary Refill: Capillary refill takes less than 2 seconds.  Neurological:     General: No focal deficit present.     Mental Status: She is alert and oriented to person, place, and time.    Laboratory examination:   No results  for input(s): NA, K, CL, CO2, GLUCOSE, BUN, CREATININE, CALCIUM, GFRNONAA, GFRAA in the last 8760 hours. CrCl cannot be calculated (Patient's most recent lab result is older than the maximum 21 days allowed.).  CMP Latest Ref Rng & Units 08/04/2019 03/06/2015 01/25/2015  Glucose 70 - 99 mg/dL 89 10/04/2019) 85  BUN 6 - 20 mg/dL 13 9 10  Creatinine 0.44 - 1.00 mg/dL 7.82 9.56 2.13  Sodium 135 - 145 mmol/L 135 139 143  Potassium 3.5 - 5.1 mmol/L 3.7 4.1 4.1  Chloride 98 - 111 mmol/L 104 104 101  CO2 22 - 32 mmol/L 24 27 25   Calcium 8.9 - 10.3 mg/dL 9.1 9.2 9.8  Total Protein 6.0 - 8.3 g/dL - 6.3 6.5  Total Bilirubin 0.3 - 1.2 mg/dL - 0.4 0.2  Alkaline Phos 39 - 117 U/L - 38(L) 86  AST 0 - 37 U/L - 24 32  ALT 0 - 35 U/L - 43(H) 39(H)   CBC Latest Ref Rng & Units 08/04/2019 03/06/2015 01/25/2015  WBC 4.0 - 10.5 K/uL 3.5(L) 3.2(L) 3.5  Hemoglobin 12.0 - 15.0 g/dL 01/27/2015 11.2(L) 11.8  Hematocrit 36.0 - 46.0 % 40.3 34.4(L) 35.5  Platelets 150 - 400 K/uL 252 227 287   Lipid Panel  No results found for: CHOL, TRIG, HDL, CHOLHDL, VLDL, LDLCALC, LDLDIRECT HEMOGLOBIN A1C No results found for: HGBA1C, MPG TSH No results for input(s): TSH in the last 8760 hours. BNP (last 3 results) No results for input(s): BNP in the last 8760 hours.  Medications and allergies   Allergies  Allergen Reactions  . Protonix [Pantoprazole] Rash     Current Outpatient Medications  Medication Instructions  . acetaminophen (TYLENOL) 1,000 mg, Oral, Every 6 hours PRN  . diazepam (VALIUM) 2 mg, Oral, Every 8 hours PRN  . megestrol (MEGACE) 40 MG tablet 3x5d, 2x5d, then 1 daily to help control vaginal bleeding. Stop taking when bleeding stops.  . metroNIDAZOLE (FLAGYL) 500 mg, Oral, 2 times daily    No intake/output data recorded. No intake/output data recorded.    Radiology:   CTA of the chest 04/04/2021: Somewhat limited evaluation of the pulmonary artery although no  definitive pulmonary emboli are seen.   Soft  tissue lesion adjacent to the superior aspect of the  pericardium and left pulmonary artery. This may represent a  pericardial cyst. Echocardiogram may be helpful on a nonemergent  basis for further evaluation.   No other focal abnormality is noted.   Additional consideration would be thrombosed left atrial appendage.  Echocardiography ideally via trans esophageal route would be  helpful.    Electronically Signed  By: 06/04/2021 M.D.  On: 04/04/2021 15:44  Cardiac Studies:   None  Report only Normal sinus rhythm with sinus arrhythmia  Nonspecific T wave abnormality  QTc 428 mS   EKG 08/04/2019: Normal sinus rhythm at rate of 83 bpm, normal axis, nonspecific T abnormality.  Assessment   1. Atypical chest pain suggestive of musculoskeletal etiology.  She has been negative for myocardial injury by serial troponin evaluation That was performed in [place] at [facility]. 2. Abnormal CT scan of the chest revealing abnormal cardiac structure. 3. Morbid obesity  Recommendations:   Patient was brought in Semi- urgently in the outpatient basis for evaluation of cardiac structures.  She has now been scheduled for  TEE.  I have explained to her regarding the risks of TEE that includes perforation, trauma, bleeding but not limited to this and she is willing to proceed.  She was hypertensive today, however evaluation at [facility], blood pressure was normal at 126/67 mmHg.  Suspect this is due to anxiety.  Weight loss has been discussed with the patient.  Further recommendations to follow following TEE.  If TEE is abnormal, she may need cardiac MRI.   [facility], MD, [facility] 04/05/2021, 3:41 PM Office: [phone] Pager: [phone]

## 2021-04-05 NOTE — Interval H&P Note (Signed)
History and Physical Interval Note:  04/05/2021 3:46 PM  Miranda Oliver  has presented today for surgery, with the diagnosis of thrombus.  The various methods of treatment have been discussed with the patient and family. After consideration of risks, benefits and other options for treatment, the patient has consented to  Procedure(s): TRANSESOPHAGEAL ECHOCARDIOGRAM (TEE) (N/A) as a surgical intervention.  The patient's history has been reviewed, patient examined, no change in status, stable for surgery.  I have reviewed the patient's chart and labs.  Questions were answered to the patient's satisfaction.     Yates Decamp

## 2021-04-05 NOTE — Discharge Instructions (Signed)
The patient's husband is given a work excuse note for Friday, Apr 05, 2021 due to providing transportation for patient to hospital for appointment.

## 2021-04-05 NOTE — CV Procedure (Signed)
TEE: Under moderate sedation, TEE was performed without complications: LV: Normal. Normal EF. RV: Normal LA: Normal. Left atrial appendage: Normal without thrombus. Normal function. Inter atrial septum is intact without defect. Double contrast study negative for atrial level shunting. No late appearance of bubbles either. Q-tip sign noted (normal variant)  RA: Normal  MV: Normal Trace MR. TV: Normal Trace TR AV: Normal. No AI or AS. PV: Normal. Trace PI.  Thoracic and ascending aorta: Normal without significant plaque or atheromatous changes.  Conscious sedation protocol was followed, I personally administered conscious sedation and monitored the patient. Patient received 5 milligrams of Versed and 50 mcg fentanyl . Patient tolerated the procedure well and there was no complication from conscious sedation. Time administered was 35 min.  Impression: Coumadin ridge is a normal anatomic variant that is occasionally found in the left atrium. It can present as a linear or nodular mass which can undulate with cardiac motion and if particularly prominent, can easily be mistaken for a tumor or thrombus.    Yates Decamp, MD, [facility] 04/05/2021, 4:31 PM Office: [phone] Pager: [phone]

## 2021-04-05 NOTE — Progress Notes (Signed)
  Echocardiogram Echocardiogram Transesophageal has been performed.  Miranda Oliver 04/05/2021, 4:35 PM

## 2021-04-06 ENCOUNTER — Telehealth: Payer: Self-pay | Admitting: Cardiology

## 2021-04-08 ENCOUNTER — Encounter (HOSPITAL_COMMUNITY): Payer: Self-pay | Admitting: Cardiology

## 2021-05-31 NOTE — Telephone Encounter (Signed)
NO further info
